# Patient Record
Sex: Male | Born: 1975 | Race: White | Hispanic: No | Marital: Married | Smoking: Never smoker
Health system: Southern US, Community
[De-identification: ages and names within clinical notes are randomized; demographics above are authoritative.]

## PROBLEM LIST (undated history)

## (undated) DIAGNOSIS — T4145XA Adverse effect of unspecified anesthetic, initial encounter: Secondary | ICD-10-CM

## (undated) DIAGNOSIS — S63042A Subluxation of carpometacarpal joint of left thumb, initial encounter: Secondary | ICD-10-CM

## (undated) DIAGNOSIS — Z98811 Dental restoration status: Secondary | ICD-10-CM

## (undated) DIAGNOSIS — Z87442 Personal history of urinary calculi: Secondary | ICD-10-CM

## (undated) DIAGNOSIS — K635 Polyp of colon: Secondary | ICD-10-CM

## (undated) DIAGNOSIS — F32A Depression, unspecified: Secondary | ICD-10-CM

## (undated) DIAGNOSIS — T8859XA Other complications of anesthesia, initial encounter: Secondary | ICD-10-CM

## (undated) DIAGNOSIS — K219 Gastro-esophageal reflux disease without esophagitis: Secondary | ICD-10-CM

## (undated) HISTORY — PX: TONSILLECTOMY: SUR1361

## (undated) HISTORY — PX: LUMBAR FUSION: SHX111

## (undated) HISTORY — PX: VASECTOMY: SHX75

## (undated) HISTORY — DX: Polyp of colon: K63.5

## (undated) HISTORY — DX: Depression, unspecified: F32.A

## (undated) HISTORY — PX: LUMBAR LAMINECTOMY: SHX95

---

## 2008-02-18 ENCOUNTER — Encounter: Admission: RE | Admit: 2008-02-18 | Discharge: 2008-02-18 | Payer: Self-pay | Admitting: Orthopaedic Surgery

## 2008-09-07 ENCOUNTER — Ambulatory Visit: Payer: Self-pay | Admitting: Internal Medicine

## 2008-09-07 DIAGNOSIS — Z87442 Personal history of urinary calculi: Secondary | ICD-10-CM | POA: Insufficient documentation

## 2008-09-07 LAB — CONVERTED CEMR LAB
AST: 19 units/L (ref 0–37)
Albumin: 4.3 g/dL (ref 3.5–5.2)
Alkaline Phosphatase: 64 units/L (ref 39–117)
BUN: 14 mg/dL (ref 6–23)
Bilirubin, Direct: 0.1 mg/dL (ref 0.0–0.3)
Chloride: 110 meq/L (ref 96–112)
Eosinophils Absolute: 0.1 10*3/uL (ref 0.0–0.7)
Eosinophils Relative: 1.5 % (ref 0.0–5.0)
GFR calc non Af Amer: 103 mL/min
HDL: 35.2 mg/dL — ABNORMAL LOW (ref >39.0)
MCV: 87.5 fL (ref 78.0–100.0)
Monocytes Relative: 10.9 % (ref 3.0–12.0)
Neutrophils Relative %: 60.7 % (ref 43.0–77.0)
Platelets: 323 10*3/uL (ref 150–400)
Potassium: 4.5 meq/L (ref 3.5–5.1)
Total CHOL/HDL Ratio: 5.2
VLDL: 27 mg/dL (ref 0–40)
WBC: 8.8 10*3/uL (ref 4.5–10.5)

## 2009-04-25 ENCOUNTER — Ambulatory Visit: Payer: Self-pay | Admitting: Internal Medicine

## 2009-04-25 DIAGNOSIS — J020 Streptococcal pharyngitis: Secondary | ICD-10-CM | POA: Insufficient documentation

## 2009-06-30 ENCOUNTER — Encounter: Admission: RE | Admit: 2009-06-30 | Discharge: 2009-06-30 | Payer: Self-pay | Admitting: Orthopaedic Surgery

## 2009-08-16 ENCOUNTER — Ambulatory Visit: Payer: Self-pay | Admitting: Internal Medicine

## 2009-08-16 DIAGNOSIS — F341 Dysthymic disorder: Secondary | ICD-10-CM | POA: Insufficient documentation

## 2009-08-23 ENCOUNTER — Telehealth: Payer: Self-pay | Admitting: Internal Medicine

## 2009-09-27 ENCOUNTER — Ambulatory Visit: Payer: Self-pay | Admitting: Internal Medicine

## 2009-09-27 DIAGNOSIS — F411 Generalized anxiety disorder: Secondary | ICD-10-CM | POA: Insufficient documentation

## 2009-12-04 ENCOUNTER — Ambulatory Visit: Payer: Self-pay | Admitting: Internal Medicine

## 2010-08-30 ENCOUNTER — Ambulatory Visit: Payer: Self-pay | Admitting: Internal Medicine

## 2010-08-30 DIAGNOSIS — J3501 Chronic tonsillitis: Secondary | ICD-10-CM

## 2010-08-30 HISTORY — DX: Chronic tonsillitis: J35.01

## 2010-09-18 ENCOUNTER — Encounter: Payer: Self-pay | Admitting: Internal Medicine

## 2011-01-15 NOTE — Assessment & Plan Note (Signed)
Summary: strept/dm   Vital Signs:  Yu profile:   35 year old male Weight:      166 pounds Temp:     98.3 degrees F oral BP sitting:   100 / 70  (left arm) Cuff size:   regular  Vitals Entered By: Duard Brady LPN (August 30, 2010 4:04 PM) CC: c/o sore throat - seen in Urgent care monday   on abx  Is Yu Diabetic? No   CC:  c/o sore throat - seen in Urgent care monday   on abx .  History of Present Illness: Kyle Yu, who presents complaining of persistent sore throat.  Roughly 5 days ago.  He presented to an urgent care due to his severe sore throat and was placed on Omnicef 300 mg b.i.d..  He does have a history of childhood allergy to penicillin, which apparently was quite severe.  He has tolerated medication well earned a week.  He had headache and fever which have resolved.  He still has sore throat and malaise, but clearly improved.  He is requesting ENT referral for consideration of tonsillectomy.  He has had recurrent episodes of strep pharyngitis, which has increased in severity to  two or 3 times per year.  He states he also has occasional swallowing difficulty, due to a chronically enlarged left tonsil  Allergies: 1)  ! Penicillin G Potassium (Penicillin G Potassium) 2)  ! * Contrast Dye  Past History:  Past Medical History: Reviewed history from 09/27/2009 and no changes required. Nephrolithiasis, hx of penicillin allergy IVP dye allergy mild dyslipidemia Anxiety  Review of Systems       The Yu complains of anorexia and fever.  The Yu denies weight loss, weight gain, vision loss, decreased hearing, hoarseness, chest pain, syncope, dyspnea on exertion, peripheral edema, prolonged cough, headaches, hemoptysis, abdominal pain, melena, hematochezia, severe indigestion/heartburn, hematuria, incontinence, genital sores, muscle weakness, suspicious skin lesions, transient blindness, difficulty walking, depression, unusual weight change,  abnormal bleeding, enlarged lymph nodes, angioedema, breast masses, and testicular masses.    Physical Exam  General:  Well-developed,well-nourished,in no acute distress; alert,appropriate and cooperative throughout examination Head:  Normocephalic and atraumatic without obvious abnormalities. No apparent alopecia or balding. Eyes:  No corneal or conjunctival inflammation noted. EOMI. Perrla. Funduscopic exam benign, without hemorrhages, exudates or papilledema. Vision grossly normal. Ears:  External ear exam shows no significant lesions or deformities.  Otoscopic examination reveals clear canals, tympanic membranes are intact bilaterally without bulging, retraction, inflammation or discharge. Hearing is grossly normal bilaterally. Mouth:  mild pharyngeal erythema and considerable tonsillar enlargement especially  the  left with scattered pustules Neck:  no adenopathy   Impression & Recommendations:  Problem # 1:  STREP THROAT (ICD-034.0)  The following medications were removed from the medication list:    Zithromax Z-pak 250 Mg Tabs (Azithromycin) .Marland Kitchen..Marland Kitchen Two initially, then one daily for 4 days His updated medication list for this problem includes:    Cefdinir 300 Mg Caps (Cefdinir) .Marland Kitchen..Marland Kitchen Two times a day x10 days the Yu has had recurrent episodes of strep pharyngitis, tonsillitis, and has chronic left tonsillitis.  Per his request, will set up for a ENT Evaluation   Orders: ENT Referral (ENT)  Complete Medication List: 1)  Lyrica 75 Mg Caps (Pregabalin) .Marland Kitchen.. 1 at bedtime 2)  Cefdinir 300 Mg Caps (Cefdinir) .... Two times a day x10 days  Yu Instructions: 1)  Take your antibiotic as prescribed until ALL of it is gone, but stop if you develop  a rash or swelling and contact our office as soon as possible. 2)  Celebrex two capsules daily 3)  ENT follow-up as scheduled

## 2011-01-15 NOTE — Consult Note (Signed)
Summary: [facility]  [facility]   Imported By: Maryln Gottron 09/25/2010 10:36:50  _____________________________________________________________________  External Attachment:    Type:   Image     Comment:   External Document

## 2011-03-20 ENCOUNTER — Encounter: Payer: Self-pay | Admitting: Internal Medicine

## 2011-03-20 ENCOUNTER — Ambulatory Visit (INDEPENDENT_AMBULATORY_CARE_PROVIDER_SITE_OTHER): Payer: BC Managed Care – PPO | Admitting: Internal Medicine

## 2011-03-20 VITALS — BP 110/70 | Temp 97.9°F | Wt 184.0 lb

## 2011-03-20 DIAGNOSIS — J029 Acute pharyngitis, unspecified: Secondary | ICD-10-CM

## 2011-03-20 DIAGNOSIS — J3501 Chronic tonsillitis: Secondary | ICD-10-CM

## 2011-03-20 MED ORDER — METRONIDAZOLE 500 MG PO TABS: 500.0000 mg | ORAL_TABLET | Freq: Three times a day (TID) | ORAL | Status: AC

## 2011-03-20 NOTE — Patient Instructions (Signed)
Take your antibiotic as prescribed until ALL of it is gone, but stop if you develop a rash, swelling, or any side effects of the medication.  Contact our office as soon as possible if  there are side effects of the medication.  Avoid all alcohol use while on this antibiotic  Call or return to clinic prn if these symptoms worsen or fail to improve as anticipated.  Consider ENT referral  Ibuprofen as needed

## 2011-03-20 NOTE — Progress Notes (Signed)
  Subjective:    Patient ID: Kyle Yu, male    DOB: 09-30-1976, 35 y.o.   MRN: [id number]  HPI  35 year old patient who has a history of chronic recurrent tonsillitis. He usually has several episodes a year and these are often positive for group A strep. He has seen ENT in the past and almost had the tonsillectomy performed but this was canceled due to job related obligations. For the past 2 days she has had worsening sore throat. There's been no definite fever a rapid strep screen today negative;  unfortunately he does have a penicillin allergy    Review of Systems  Constitutional: Negative for fever, chills, appetite change and fatigue.  HENT: Negative for hearing loss, ear pain, congestion, sore throat, trouble swallowing, neck stiffness, dental problem, voice change and tinnitus.   Eyes: Negative for pain, discharge and visual disturbance.  Respiratory: Negative for cough, chest tightness, wheezing and stridor.   Cardiovascular: Negative for chest pain, palpitations and leg swelling.  Gastrointestinal: Negative for nausea, vomiting, abdominal pain, diarrhea, constipation, blood in stool and abdominal distention.  Genitourinary: Negative for urgency, hematuria, flank pain, discharge, difficulty urinating and genital sores.  Musculoskeletal: Negative for myalgias, back pain, joint swelling, arthralgias and gait problem.  Skin: Negative for rash.  Neurological: Negative for dizziness, syncope, speech difficulty, weakness, numbness and headaches.  Hematological: Negative for adenopathy. Does not bruise/bleed easily.  Psychiatric/Behavioral: Negative for behavioral problems and dysphoric mood. The patient is not nervous/anxious.        Objective:   Physical Exam  Constitutional: He is oriented to person, place, and time. He appears well-developed.  HENT:  Head: Normocephalic.  Right Ear: External ear normal.  Left Ear: External ear normal.       Marked tonsillar enlargement left  greater than the right; no exudate. No cervical adenopathy.  Eyes: Conjunctivae and EOM are normal.  Neck: Normal range of motion.  Cardiovascular: Normal rate and normal heart sounds.   Pulmonary/Chest: Breath sounds normal.  Abdominal: Bowel sounds are normal.  Musculoskeletal: Normal range of motion. He exhibits no edema and no tenderness.  Neurological: He is alert and oriented to person, place, and time.  Psychiatric: He has a normal mood and affect. His behavior is normal.          Assessment & Plan:  Chronic recurrent tonsillitis. In view of his penicillin allergy we'll treat with metronidazole 500 mg 3 times a day for 7 days. He will consider followup with ENT for a tonsillectomy due to his chronic recurrent episodes

## 2011-03-25 ENCOUNTER — Telehealth: Payer: Self-pay | Admitting: Internal Medicine

## 2011-03-25 MED ORDER — AZITHROMYCIN 250 MG PO TABS: ORAL_TABLET | ORAL | Status: DC

## 2011-03-25 NOTE — Telephone Encounter (Signed)
rx sent to [facility]. Attempt to call pt - ans mach - lmtcb if problems ,rx done

## 2011-03-25 NOTE — Telephone Encounter (Signed)
Addended by: Duard Brady on: 03/25/2011 04:13 PM   Modules accepted: Orders

## 2011-03-25 NOTE — Telephone Encounter (Signed)
Please advise 

## 2011-03-25 NOTE — Telephone Encounter (Signed)
Pt called said that the antibiotic given for Strep is not working. Pt req zpak to be called in to [facility].

## 2011-03-25 NOTE — Telephone Encounter (Signed)
Ok for [employer]

## 2011-08-01 ENCOUNTER — Emergency Department (INDEPENDENT_AMBULATORY_CARE_PROVIDER_SITE_OTHER): Payer: BC Managed Care – PPO

## 2011-08-01 ENCOUNTER — Encounter (HOSPITAL_BASED_OUTPATIENT_CLINIC_OR_DEPARTMENT_OTHER): Payer: Self-pay | Admitting: Emergency Medicine

## 2011-08-01 ENCOUNTER — Emergency Department (HOSPITAL_BASED_OUTPATIENT_CLINIC_OR_DEPARTMENT_OTHER)
Admission: EM | Admit: 2011-08-01 | Discharge: 2011-08-01 | Disposition: A | Discharge status: Home / Self Care | Payer: BC Managed Care – PPO | Attending: Emergency Medicine | Admitting: Emergency Medicine

## 2011-08-01 DIAGNOSIS — R079 Chest pain, unspecified: Secondary | ICD-10-CM

## 2011-08-01 DIAGNOSIS — N200 Calculus of kidney: Secondary | ICD-10-CM | POA: Insufficient documentation

## 2011-08-01 DIAGNOSIS — F341 Dysthymic disorder: Secondary | ICD-10-CM | POA: Insufficient documentation

## 2011-08-01 DIAGNOSIS — S62112A Displaced fracture of triquetrum [cuneiform] bone, left wrist, initial encounter for closed fracture: Secondary | ICD-10-CM

## 2011-08-01 DIAGNOSIS — M542 Cervicalgia: Secondary | ICD-10-CM | POA: Insufficient documentation

## 2011-08-01 DIAGNOSIS — S139XXA Sprain of joints and ligaments of unspecified parts of neck, initial encounter: Secondary | ICD-10-CM | POA: Insufficient documentation

## 2011-08-01 DIAGNOSIS — E785 Hyperlipidemia, unspecified: Secondary | ICD-10-CM | POA: Insufficient documentation

## 2011-08-01 DIAGNOSIS — Z981 Arthrodesis status: Secondary | ICD-10-CM

## 2011-08-01 DIAGNOSIS — Y92410 Unspecified street and highway as the place of occurrence of the external cause: Secondary | ICD-10-CM | POA: Insufficient documentation

## 2011-08-01 DIAGNOSIS — M79609 Pain in unspecified limb: Secondary | ICD-10-CM

## 2011-08-01 DIAGNOSIS — Z79899 Other long term (current) drug therapy: Secondary | ICD-10-CM | POA: Insufficient documentation

## 2011-08-01 DIAGNOSIS — S60229A Contusion of unspecified hand, initial encounter: Secondary | ICD-10-CM

## 2011-08-01 DIAGNOSIS — S20219A Contusion of unspecified front wall of thorax, initial encounter: Secondary | ICD-10-CM

## 2011-08-01 DIAGNOSIS — S161XXA Strain of muscle, fascia and tendon at neck level, initial encounter: Secondary | ICD-10-CM

## 2011-08-01 DIAGNOSIS — S62113A Displaced fracture of triquetrum [cuneiform] bone, unspecified wrist, initial encounter for closed fracture: Secondary | ICD-10-CM | POA: Insufficient documentation

## 2011-08-01 MED ORDER — HYDROCODONE-ACETAMINOPHEN 5-500 MG PO TABS: 1.0000 | ORAL_TABLET | Freq: Four times a day (QID) | ORAL | Status: DC | PRN

## 2011-08-01 MED ORDER — IBUPROFEN 800 MG PO TABS
800.0000 mg | ORAL_TABLET | Freq: Once | ORAL | Status: AC
  Administered 2011-08-01: 800 mg via ORAL
  Filled 2011-08-01: qty 1

## 2011-08-01 MED ORDER — NAPROXEN 500 MG PO TABS: 500.0000 mg | ORAL_TABLET | Freq: Two times a day (BID) | ORAL | Status: DC

## 2011-08-01 MED ORDER — CYCLOBENZAPRINE HCL 10 MG PO TABS: 10.0000 mg | ORAL_TABLET | Freq: Two times a day (BID) | ORAL | Status: DC | PRN

## 2011-08-01 MED ORDER — OXYCODONE-ACETAMINOPHEN 5-325 MG PO TABS
2.0000 | ORAL_TABLET | Freq: Once | ORAL | Status: AC
  Administered 2011-08-01: 2 via ORAL
  Filled 2011-08-01 (×2): qty 1
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
## 2011-08-01 NOTE — ED Notes (Signed)
Pt. In no distress with family at bedside.. Pt. Has been informed by MD about x-ray results and wait.

## 2011-08-07 ENCOUNTER — Encounter (HOSPITAL_BASED_OUTPATIENT_CLINIC_OR_DEPARTMENT_OTHER): Payer: Self-pay | Admitting: *Deleted

## 2011-08-07 ENCOUNTER — Emergency Department (HOSPITAL_BASED_OUTPATIENT_CLINIC_OR_DEPARTMENT_OTHER)
Admission: EM | Admit: 2011-08-07 | Discharge: 2011-08-07 | Disposition: A | Discharge status: Home / Self Care | Payer: No Typology Code available for payment source | Attending: Emergency Medicine | Admitting: Emergency Medicine

## 2011-08-07 ENCOUNTER — Emergency Department (INDEPENDENT_AMBULATORY_CARE_PROVIDER_SITE_OTHER): Payer: No Typology Code available for payment source

## 2011-08-07 DIAGNOSIS — F341 Dysthymic disorder: Secondary | ICD-10-CM | POA: Insufficient documentation

## 2011-08-07 DIAGNOSIS — R079 Chest pain, unspecified: Secondary | ICD-10-CM

## 2011-08-07 DIAGNOSIS — Y92410 Unspecified street and highway as the place of occurrence of the external cause: Secondary | ICD-10-CM | POA: Insufficient documentation

## 2011-08-07 DIAGNOSIS — R0602 Shortness of breath: Secondary | ICD-10-CM | POA: Insufficient documentation

## 2011-08-07 DIAGNOSIS — E785 Hyperlipidemia, unspecified: Secondary | ICD-10-CM | POA: Insufficient documentation

## 2011-08-07 DIAGNOSIS — T148XXA Other injury of unspecified body region, initial encounter: Secondary | ICD-10-CM

## 2011-08-07 MED ORDER — HYDROCODONE-ACETAMINOPHEN 5-325 MG PO TABS: 2.0000 | ORAL_TABLET | ORAL | Status: AC | PRN

## 2011-08-07 NOTE — ED Provider Notes (Signed)
Medical screening examination/treatment/procedure(s) were performed by non-physician practitioner and as supervising physician I was immediately available for consultation/collaboration.   Forbes Cellar, MD 08/07/11 1701
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
## 2011-08-07 NOTE — ED Notes (Signed)
Ambulatory to radiology

## 2011-08-07 NOTE — ED Notes (Signed)
Karen Sofia, PA-C at bedside 
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
## 2011-11-13 ENCOUNTER — Ambulatory Visit (INDEPENDENT_AMBULATORY_CARE_PROVIDER_SITE_OTHER): Payer: BC Managed Care – PPO | Admitting: Family Medicine

## 2011-11-13 ENCOUNTER — Encounter: Payer: Self-pay | Admitting: Family Medicine

## 2011-11-13 VITALS — BP 120/78 | Temp 98.9°F | Wt 200.0 lb

## 2011-11-13 DIAGNOSIS — R21 Rash and other nonspecific skin eruption: Secondary | ICD-10-CM

## 2011-11-13 MED ORDER — METHYLPREDNISOLONE ACETATE 80 MG/ML IJ SUSP
120.0000 mg | Freq: Once | INTRAMUSCULAR | Status: AC
  Administered 2011-11-13: 120 mg via INTRAMUSCULAR

## 2011-11-13 NOTE — Patient Instructions (Signed)
Take over the counter antihistamine such as Allegra, Zyrtec, or Benadryl. Avoid heat for next few days.

## 2011-11-13 NOTE — Progress Notes (Signed)
  Subjective:    Patient ID: Kyle Yu, male    DOB: 16-Mar-1976, 35 y.o.   MRN: [id number]  HPI Pruritic rash. Started couple days ago after hunting over the weekend. Concerned about contact dermatitis. No alleviating factors. Exacerbated by heat. Patient denies any fever or chills.  Location is back, anterior trunk, and right hand.   Review of Systems  Constitutional: Negative for fever and chills.  Skin: Positive for rash.       Objective:   Physical Exam  Constitutional: He appears well-developed and well-nourished.  Cardiovascular: Normal rate and regular rhythm.   Pulmonary/Chest: Effort normal and breath sounds normal. No respiratory distress. He has no wheezes. He has no rales.  Skin:       Patient has slightly raised erythematous blanching rash right hand, diffusely on the back and anterior trunk. No pustules. Non-scaly.          Assessment & Plan:   pruritic skin rash. Probable contact dermatitis. Depo-Medrol 120 mg IM given. Over-the-counter antihistamine as needed.

## 2012-12-15 ENCOUNTER — Encounter: Payer: Self-pay | Admitting: Internal Medicine

## 2012-12-15 ENCOUNTER — Ambulatory Visit (INDEPENDENT_AMBULATORY_CARE_PROVIDER_SITE_OTHER): Payer: BC Managed Care – PPO | Admitting: Internal Medicine

## 2012-12-15 VITALS — BP 102/70 | HR 76 | Temp 98.1°F | Resp 16 | Wt 198.0 lb

## 2012-12-15 DIAGNOSIS — R079 Chest pain, unspecified: Secondary | ICD-10-CM

## 2012-12-15 DIAGNOSIS — E7800 Pure hypercholesterolemia, unspecified: Secondary | ICD-10-CM

## 2012-12-15 MED ORDER — ATORVASTATIN CALCIUM 80 MG PO TABS: 80.0000 mg | ORAL_TABLET | Freq: Every day | ORAL | Status: DC

## 2012-12-15 MED ORDER — ASPIRIN 81 MG PO TBEC: 81.0000 mg | DELAYED_RELEASE_TABLET | Freq: Every day | ORAL | Status: DC

## 2012-12-15 NOTE — Patient Instructions (Signed)
Stress test as discussed Take aspirin 81 mg daily Low-cholesterol dietCholesterol Cholesterol is a white, waxy, fat-like protein needed by your body in small amounts. The liver makes all the cholesterol you need. It is carried from the liver by the blood through the blood vessels. Deposits (plaque) may build up on blood vessel walls. This makes the arteries narrower and stiffer. Plaque increases the risk for heart attack and stroke. You cannot feel your cholesterol level even if it is very high. The only way to know is by a blood test to check your lipid (fats) levels. Once you know your cholesterol levels, you should keep a record of the test results. Work with your caregiver to to keep your levels in the desired range. WHAT THE RESULTS MEAN:  Total cholesterol is a rough measure of all the cholesterol in your blood.   LDL is the so-called bad cholesterol. This is the type that deposits cholesterol in the walls of the arteries. You want this level to be low.   HDL is the good cholesterol because it cleans the arteries and carries the LDL away. You want this level to be high.   Triglycerides are fat that the body can either burn for energy or store. High levels are closely linked to heart disease.  DESIRED LEVELS:  Total cholesterol below 200.   LDL below 100 for people at risk, below 70 for very high risk.   HDL above 50 is good, above 60 is best.   Triglycerides below 150.  HOW TO LOWER YOUR CHOLESTEROL:  Diet.   Choose fish or white meat chicken and [place], roasted or baked. Limit fatty cuts of red meat, fried foods, and processed meats, such as sausage and lunch meat.   Eat lots of fresh fruits and vegetables. Choose whole grains, beans, pasta, potatoes and cereals.   Use only small amounts of olive, corn or canola oils. Avoid butter, mayonnaise, shortening or palm kernel oils. Avoid foods with trans-fats.   Use skim/nonfat milk and low-fat/nonfat yogurt and cheeses. Avoid  whole milk, cream, ice cream, egg yolks and cheeses. Healthy desserts include angel food cake, ginger snaps, animal crackers, hard candy, popsicles, and low-fat/nonfat frozen yogurt. Avoid pastries, cakes, pies and cookies.   Exercise.   A regular program helps decrease LDL and raises HDL.   Helps with weight control.   Do things that increase your activity level like gardening, walking, or taking the stairs.   Medication.   May be prescribed by your caregiver to help lowering cholesterol and the risk for heart disease.   You may need medicine even if your levels are normal if you have several risk factors.  HOME CARE INSTRUCTIONS    Follow your diet and exercise programs as suggested by your caregiver.   Take medications as directed.   Have blood work done when your caregiver feels it is necessary.  MAKE SURE YOU:    Understand these instructions.   Will watch your condition.   Will get help right away if you are not doing well or get worse.  Document Released: 08/27/2001 Document Revised: 02/24/2012 Document Reviewed: 02/17/2008 [facility] Patient Information 2013 [place], [place].   Fat and Cholesterol Control Diet Cholesterol levels in your body are determined significantly by your diet. Cholesterol levels may also be related to heart disease. The following material helps to explain this relationship and discusses what you can do to help keep your heart healthy. Not all cholesterol is bad. Low-density lipoprotein (LDL) cholesterol is the "  bad" cholesterol. It may cause fatty deposits to build up inside your arteries. High-density lipoprotein (HDL) cholesterol is "good." It helps to remove the "bad" LDL cholesterol from your blood. Cholesterol is a very important risk factor for heart disease. Other risk factors are high blood pressure, smoking, stress, heredity, and weight. The heart muscle gets its supply of blood through the coronary arteries. If your LDL cholesterol is high and  your HDL cholesterol is low, you are at risk for having fatty deposits build up in your coronary arteries. This leaves less room through which blood can flow. Without sufficient blood and oxygen, the heart muscle cannot function properly and you may feel chest pains (angina pectoris). When a coronary artery closes up entirely, a part of the heart muscle may die causing a heart attack (myocardial infarction). CHECKING CHOLESTEROL When your caregiver sends your blood to a lab to be examined for cholesterol, a complete lipid (fat) profile may be done. With this test, the total amount of cholesterol and levels of LDL and HDL are determined. Triglycerides are a type of fat that circulates in the blood. They can also be used to determine heart disease risk. The list below describes what the numbers should be: Test: Total Cholesterol.  Less than 200 mg/dl.  Test: LDL "bad cholesterol."  Less than 100 mg/dl.   Less than 70 mg/dl if you are at very high risk of a heart attack or sudden cardiac death.  Test: HDL "good cholesterol."  Greater than 50 mg/dl for women.   Greater than 40 mg/dl for men.  Test: Triglycerides.  Less than 150 mg/dl.  CONTROLLING CHOLESTEROL WITH DIET Although exercise and lifestyle factors are important, your diet is key. That is because certain foods are known to raise cholesterol and others to lower it. The goal is to balance foods for their effect on cholesterol and more importantly, to replace saturated and trans fat with other types of fat, such as monounsaturated fat, polyunsaturated fat, and omega-3 fatty acids. On average, a person should consume no more than 15 to 17 g of saturated fat daily. Saturated and trans fats are considered "bad" fats, and they will raise LDL cholesterol. Saturated fats are primarily found in animal products such as meats, butter, and cream. However, that does not mean you need to give up all your favorite foods. Today, there are good tasting,  low-fat, low-cholesterol substitutes for most of the things you like to eat. Choose low-fat or nonfat alternatives. Choose round or loin cuts of red meat. These types of cuts are lowest in fat and cholesterol. Chicken (without the skin), fish, veal, and ground [place] breast are great choices. Eliminate fatty meats, such as hot dogs and salami. Even shellfish have little or no saturated fat. Have a 3 oz (85 g) portion when you eat lean meat, poultry, or fish. Trans fats are also called "partially hydrogenated oils." They are oils that have been scientifically manipulated so that they are solid at room temperature resulting in a longer shelf life and improved taste and texture of foods in which they are added. Trans fats are found in stick margarine, some tub margarines, cookies, crackers, and baked goods.   When baking and cooking, oils are a great substitute for butter. The monounsaturated oils are especially beneficial since it is believed they lower LDL and raise HDL. The oils you should avoid entirely are saturated tropical oils, such as coconut and palm.   Remember to eat a lot from food groups that  are naturally free of saturated and trans fat, including fish, fruit, vegetables, beans, grains (barley, rice, couscous, bulgur wheat), and pasta (without cream sauces).   IDENTIFYING FOODS THAT LOWER CHOLESTEROL   Soluble fiber may lower your cholesterol. This type of fiber is found in fruits such as apples, vegetables such as broccoli, potatoes, and carrots, legumes such as beans, peas, and lentils, and grains such as barley. Foods fortified with plant sterols (phytosterol) may also lower cholesterol. You should eat at least 2 g per day of these foods for a cholesterol lowering effect.   Read package labels to identify low-saturated fats, trans fat free, and low-fat foods at the supermarket. Select cheeses that have only 2 to 3 g saturated fat per ounce. Use a heart-healthy tub margarine that is free of trans  fats or partially hydrogenated oil. When buying baked goods (cookies, crackers), avoid partially hydrogenated oils. Breads and muffins should be made from whole grains (whole-wheat or whole oat flour, instead of "flour" or "enriched flour"). Buy non-creamy canned soups with reduced salt and no added fats.   FOOD PREPARATION TECHNIQUES   Never deep-fry. If you must fry, either stir-fry, which uses very little fat, or use non-stick cooking sprays. When possible, broil, bake, or roast meats, and steam vegetables. Instead of putting butter or margarine on vegetables, use lemon and herbs, applesauce, and cinnamon (for squash and sweet potatoes), nonfat yogurt, salsa, and low-fat dressings for salads.   LOW-SATURATED FAT / LOW-FAT FOOD SUBSTITUTES Meats / Saturated Fat (g)  Avoid: Steak, marbled (3 oz/85 g) / 11 g   Choose: Steak, lean (3 oz/85 g) / 4 g   Avoid: Hamburger (3 oz/85 g) / 7 g   Choose: Hamburger, lean (3 oz/85 g) / 5 g   Avoid: Ham (3 oz/85 g) / 6 g   Choose: Ham, lean cut (3 oz/85 g) / 2.4 g   Avoid: Chicken, with skin, dark meat (3 oz/85 g) / 4 g   Choose: Chicken, skin removed, dark meat (3 oz/85 g) / 2 g   Avoid: Chicken, with skin, light meat (3 oz/85 g) / 2.5 g   Choose: Chicken, skin removed, light meat (3 oz/85 g) / 1 g  Dairy / Saturated Fat (g)  Avoid: Whole milk (1 cup) / 5 g   Choose: Low-fat milk, 2% (1 cup) / 3 g   Choose: Low-fat milk, 1% (1 cup) / 1.5 g   Choose: Skim milk (1 cup) / 0.3 g   Avoid: Hard cheese (1 oz/28 g) / 6 g   Choose: Skim milk cheese (1 oz/28 g) / 2 to 3 g   Avoid: Cottage cheese, 4% fat (1 cup) / 6.5 g   Choose: Low-fat cottage cheese, 1% fat (1 cup) / 1.5 g   Avoid: Ice cream (1 cup) / 9 g   Choose: Sherbet (1 cup) / 2.5 g   Choose: Nonfat frozen yogurt (1 cup) / 0.3 g   Choose: Frozen fruit bar / trace   Avoid: Whipped cream (1 tbs) / 3.5 g   Choose: Nondairy whipped topping (1 tbs) / 1 g  Condiments / Saturated Fat  (g)  Avoid: Mayonnaise (1 tbs) / 2 g   Choose: Low-fat mayonnaise (1 tbs) / 1 g   Avoid: Butter (1 tbs) / 7 g   Choose: Extra light margarine (1 tbs) / 1 g   Avoid: Coconut oil (1 tbs) / 11.8 g   Choose: Olive oil (1 tbs) / 1.8  g   Choose: Corn oil (1 tbs) / 1.7 g   Choose: Safflower oil (1 tbs) / 1.2 g   Choose: Sunflower oil (1 tbs) / 1.4 g   Choose: Soybean oil (1 tbs) / 2.4 g   Choose: Canola oil (1 tbs) / 1 g  Document Released: 12/02/2005 Document Revised: 02/24/2012 Document Reviewed: 05/23/2011 [facility] Patient Information 2013 [place], [place].   Lipid Blood Tests Blood lipids are fats found in the circulation. Cholesterol and triglycerides are the two main lipids measured for determining your risk of getting heart and blood vessel diseases. The cholesterol in the blood is attached to two different molecules called lipoproteins. HDL is the beneficial high density lipoprotein cholesterol which reduces coronary risk. Low density lipoprotein cholesterol, the LDL, carries an increased risk for heart and vascular disease when it is high. Most of the body's fat tissue is in the form of triglycerides. Medical conditions that elevate the blood triglyceride level include diabetes, thyroid, kidney, and liver diseases.   A lipid panel usually includes the total cholesterol, LDL, and HDL blood levels. For best results, you should fast overnight before the blood tests are drawn. The following risk factors are generally accepted for different lipids:  LDL - Below 100 means low risk, 130-160 borderline risk, 160-190 high risk, above 190 is considered very high risk.   HDL - Below 40 means high risk, 40-60 average risk, 60 and higher means a reduced risk for heart and blood vessel diseases.   Total cholesterol - Below 200 means low risk, 200-240 is borderline risk, above 240 is higher risk.   Triglycerides - Below 200 means low risk, 200-400 borderline risk, above 400 means increased risk.   Many factors contribute to lipid levels including genetics, diet, exercise, body fat and medications. Reducing saturated fats in the diet and increasing fiber with fruits, vegetables and whole grains have been shown to improve blood lipids. Drugs may be prescribed to lower lipids if diet and exercise alone do not help bring the cholesterol levels under control. Document Released: 01/09/2005 Document Revised: 02/24/2012 Document Reviewed: 12/02/2005 [facility] Patient Information 2013 [place], [place].

## 2012-12-15 NOTE — Progress Notes (Signed)
Subjective:    Patient ID: Kyle Yu, male    DOB: 11-Feb-1976, 36 y.o.   MRN: [id number]  HPI 36 year old patient who is seen today to discuss the abnormal laboratory testing. He recently had a lipid profile performed that revealed a total cholesterol of 475 triglycerides 316 and HDL cholesterol of 8. He states that probably 10 years ago he did have a health maintenance exam and was not told of any cholesterol issues. Maternal grand father died of an MI at  Age 68. Mother has dyslipidemia and apparently premature coronary artery disease is fairly extensive on his mother side. The patient presents with a 3-4 month history of occasional dyspnea on exertion as well as atypical right-sided chest pain he does go to a health club 3-5 times per week but very little aerobic activities. One brother, 2 sisters are well except for diabetes. He does have a history of nephrolithiasis. Nonsmoker  Past Medical History  Diagnosis Date  . ANXIETY DEPRESSION 08/16/2009  . ANXIETY 09/27/2009  . Chronic tonsillitis 08/30/2010  . NEPHROLITHIASIS, HX OF 09/07/2008  . Hyperlipidemia     History   Social History  . Marital Status: Married    Spouse Name: N/A    Number of Children: N/A  . Years of Education: N/A   Occupational History  . Not on file.   Social History Main Topics  . Smoking status: Never Smoker   . Smokeless tobacco: Not on file  . Alcohol Use: No  . Drug Use: No  . Sexually Active: Not on file   Other Topics Concern  . Not on file   Social History Narrative  . No narrative on file    Past Surgical History  Procedure Date  . Vasectomy   . Lumbar laminectomy     No family history on file.  Allergies  Allergen Reactions  . Omnipaque (Iohexol) Other (See Comments)    Feet peel  . Penicillins Anaphylaxis  . Contrast Media (Iodinated Diagnostic Agents)     Current Outpatient Prescriptions on File Prior to Visit  Medication Sig Dispense Refill  . ibuprofen (ADVIL,MOTRIN)  200 MG tablet Take 800 mg by mouth once as needed. For pain        . pregabalin (LYRICA) 75 MG capsule Take 75 mg by mouth 2 (two) times daily.       Marland Kitchen azithromycin (ZITHROMAX Z-PAK) 250 MG tablet Take 2 tablets by mouth on day 1, followed by 1 tablet by mouth daily for 4 days. AS DIRECTED   6 each  0    BP 102/70  Pulse 76  Temp 98.1 F (36.7 C) (Oral)  Resp 16  Wt 198 lb (89.812 kg)      Review of Systems  Constitutional: Negative for fever, chills, appetite change and fatigue.  HENT: Negative for hearing loss, ear pain, congestion, sore throat, trouble swallowing, neck stiffness, dental problem, voice change and tinnitus.   Eyes: Negative for pain, discharge and visual disturbance.  Respiratory: Positive for shortness of breath. Negative for cough, chest tightness, wheezing and stridor.   Cardiovascular: Positive for chest pain. Negative for palpitations and leg swelling.  Gastrointestinal: Negative for nausea, vomiting, abdominal pain, diarrhea, constipation, blood in stool and abdominal distention.  Genitourinary: Negative for urgency, hematuria, flank pain, discharge, difficulty urinating and genital sores.  Musculoskeletal: Negative for myalgias, back pain, joint swelling, arthralgias and gait problem.  Skin: Negative for rash.  Neurological: Negative for dizziness, syncope, speech difficulty, weakness, numbness and headaches.  Hematological: Negative for adenopathy. Does not bruise/bleed easily.  Psychiatric/Behavioral: Negative for behavioral problems and dysphoric mood. The patient is not nervous/anxious.        Objective:   Physical Exam  Constitutional: He is oriented to person, place, and time. He appears well-developed.  HENT:  Head: Normocephalic.  Right Ear: External ear normal.  Left Ear: External ear normal.  Eyes: Conjunctivae normal and EOM are normal.  Neck: Normal range of motion.  Cardiovascular: Normal rate and normal heart sounds.     Pulmonary/Chest: Breath sounds normal.  Abdominal: Bowel sounds are normal.  Musculoskeletal: Normal range of motion. He exhibits no edema and no tenderness.  Neurological: He is alert and oriented to person, place, and time.  Psychiatric: He has a normal mood and affect. His behavior is normal.          Assessment & Plan:  Severe hypercholesterolemia    Severe hypercholesterolemia. Will place on atorvastatin 80. Low cholesterol diet discussed. Will set up for nuclear stress test for further risk stratification. We'll place on aspirin 81 mg

## 2012-12-21 ENCOUNTER — Telehealth: Payer: Self-pay | Admitting: *Deleted

## 2012-12-21 DIAGNOSIS — R0602 Shortness of breath: Secondary | ICD-10-CM

## 2012-12-21 DIAGNOSIS — R079 Chest pain, unspecified: Secondary | ICD-10-CM

## 2012-12-21 NOTE — Addendum Note (Signed)
Addended by: Jimmye Norman on: 12/21/2012 12:53 PM   Modules accepted: Orders

## 2012-12-21 NOTE — Telephone Encounter (Signed)
Please try to set up a  treadmill exercise stress test-Bruce protocol

## 2012-12-21 NOTE — Telephone Encounter (Signed)
Message copied by Jimmye Norman on Mon Dec 21, 2012 12:10 PM ------      Message from: Cherlyn Labella      Created: Mon Dec 21, 2012  9:18 AM       At this time the insurance company will not approve the stress test for pt. All information from the chart was given but per Nurse reviewer from [facility] it does not fit criteria needed.In order to get this procedure approved Dr. Kirtland Bouchard will needs to call [phone] option 2 with more information BEFORE 4:00 today. The case will be closed at the end of the day

## 2012-12-22 NOTE — Telephone Encounter (Signed)
Has Nicole set up the Bruce protocol ETT??

## 2012-12-23 NOTE — Telephone Encounter (Signed)
Pt is set up for 01/05/13 and is aware of appt

## 2012-12-23 NOTE — Telephone Encounter (Signed)
Dr. Amador Cunas made aware appointment is scheduled.

## 2012-12-31 ENCOUNTER — Emergency Department (HOSPITAL_COMMUNITY): Payer: BC Managed Care – PPO

## 2012-12-31 ENCOUNTER — Encounter (HOSPITAL_COMMUNITY): Payer: Self-pay | Admitting: Emergency Medicine

## 2012-12-31 ENCOUNTER — Emergency Department (HOSPITAL_COMMUNITY)
Admission: EM | Admit: 2012-12-31 | Discharge: 2013-01-01 | Disposition: A | Discharge status: Home / Self Care | Payer: BC Managed Care – PPO | Attending: Emergency Medicine | Admitting: Emergency Medicine

## 2012-12-31 DIAGNOSIS — Z7982 Long term (current) use of aspirin: Secondary | ICD-10-CM | POA: Insufficient documentation

## 2012-12-31 DIAGNOSIS — Z87440 Personal history of urinary (tract) infections: Secondary | ICD-10-CM | POA: Insufficient documentation

## 2012-12-31 DIAGNOSIS — E785 Hyperlipidemia, unspecified: Secondary | ICD-10-CM | POA: Insufficient documentation

## 2012-12-31 DIAGNOSIS — Z79899 Other long term (current) drug therapy: Secondary | ICD-10-CM | POA: Insufficient documentation

## 2012-12-31 DIAGNOSIS — R61 Generalized hyperhidrosis: Secondary | ICD-10-CM | POA: Insufficient documentation

## 2012-12-31 DIAGNOSIS — Z8659 Personal history of other mental and behavioral disorders: Secondary | ICD-10-CM | POA: Insufficient documentation

## 2012-12-31 DIAGNOSIS — R079 Chest pain, unspecified: Secondary | ICD-10-CM

## 2012-12-31 LAB — CBC
HCT: 44.1 % (ref 39.0–52.0)
MCV: 83.4 fL (ref 78.0–100.0)
RBC: 5.29 MIL/uL (ref 4.22–5.81)
RDW: 14.1 % (ref 11.5–15.5)
WBC: 8 10*3/uL (ref 4.0–10.5)

## 2012-12-31 LAB — BASIC METABOLIC PANEL WITH GFR
BUN: 17 mg/dL (ref 6–23)
CO2: 27 meq/L (ref 19–32)
Calcium: 9.9 mg/dL (ref 8.4–10.5)
Chloride: 101 meq/L (ref 96–112)
Creatinine, Ser: 0.98 mg/dL (ref 0.50–1.35)
GFR calc Af Amer: >90 mL/min
GFR calc non Af Amer: >90 mL/min
Glucose, Bld: 85 mg/dL (ref 70–99)
Potassium: 4.1 meq/L (ref 3.5–5.1)
Sodium: 137 meq/L (ref 135–145)

## 2012-12-31 LAB — POCT I-STAT TROPONIN I: Troponin i, poc: 0 ng/mL (ref 0.00–0.08)

## 2012-12-31 LAB — TROPONIN I: Troponin I: <0.3 ng/mL

## 2012-12-31 MED ORDER — NITROGLYCERIN 0.4 MG SL SUBL
0.4000 mg | SUBLINGUAL_TABLET | SUBLINGUAL | Status: DC | PRN
  Administered 2012-12-31 (×2): 0.4 mg via SUBLINGUAL
  Filled 2012-12-31: qty 25

## 2012-12-31 MED ORDER — ASPIRIN 81 MG PO CHEW
324.0000 mg | CHEWABLE_TABLET | Freq: Once | ORAL | Status: AC
  Administered 2012-12-31: 324 mg via ORAL
  Filled 2012-12-31: qty 4

## 2012-12-31 NOTE — ED Notes (Signed)
Pt presenting to ed with c/o chest pain greater than one week pt states he was recently diagnosed with elevated cholesterol and has been on medications. Pt states onset today while sitting at his desk at work. Pt states it "feels like I swallowed a Frisbee" pt states he's scheduled for a stress test next week. Pt denies nausea and vomiting

## 2012-12-31 NOTE — ED Provider Notes (Signed)
History     CSN: [id number]  Arrival date & time 12/31/12  1609   First MD Initiated Contact with Patient 12/31/12 1653      Chief Complaint  Patient presents with  . Chest Pain    (Consider location/radiation/quality/duration/timing/severity/associated sxs/prior treatment) Patient is a 37 y.o. male presenting with chest pain. The history is provided by the patient.  Chest Pain   He had onset at 3 PM of severe anterior chest pain without radiation. Pain is rated at 10/10 but has now subsided to 1/10. There is associated dyspnea and diaphoresis but no nausea. He describes it as like a kidney stone in his chest like he swallowed a Frisbee. He had one other episode similar to this about 3 weeks ago. He had seen his physician and is scheduled for stress test next week. He is also recently diagnosed with hyperlipidemia. Other cardiac risk factors are positive family history. He is a nonsmoker no history of hypertension or diabetes. 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 He does not get dyspnea with routine activities.  Past Medical History  Diagnosis Date  . ANXIETY DEPRESSION 08/16/2009  . ANXIETY 09/27/2009  . Chronic tonsillitis 08/30/2010  . NEPHROLITHIASIS, HX OF 09/07/2008  . Hyperlipidemia     Past Surgical History  Procedure Date  . Vasectomy   . Lumbar laminectomy     No family history on file.  History  Substance Use Topics  . Smoking status: Never Smoker   . Smokeless tobacco: Not on file  . Alcohol Use: No      Review of Systems  Cardiovascular: Positive for chest pain.  All other systems reviewed and are negative.    Allergies  Omnipaque; Penicillins; and Contrast media  Home Medications   Current Outpatient Rx  Name  Route  Sig  Dispense  Refill  . ASPIRIN 81 MG PO TBEC   Oral   Take 1 tablet (81 mg total) by mouth daily. Swallow whole.   30 tablet   12   . ATORVASTATIN CALCIUM 80 MG PO TABS   Oral   Take 1 tablet (80 mg total) by mouth daily.   90 tablet   3   . IBUPROFEN 200 MG PO TABS   Oral   Take 800 mg by mouth once as needed. For pain.         Marland Kitchen PREGABALIN 75 MG PO CAPS   Oral   Take 75 mg by mouth 2 (two) times daily. For back pain.         Marland Kitchen AZITHROMYCIN 250 MG PO TABS      Take 2 tablets by mouth on day 1, followed by 1 tablet by mouth daily for 4 days. AS DIRECTED    6 each   0     BP 132/68  Pulse 61  Temp 98.3 F (36.8 C) (Oral)  Resp 14  SpO2 99%  Physical Exam  Nursing note and vitals reviewed.  37 year old male, resting comfortably and in no acute distress. Vital signs are normal. Oxygen saturation is 99%, which is normal. Head is normocephalic and atraumatic. PERRLA, EOMI. Oropharynx is clear. Neck is nontender and supple without adenopathy or JVD. Back is nontender and there is no CVA tenderness. Lungs are clear without rales, wheezes, or rhonchi. Chest is nontender. Heart has regular rate and rhythm without murmur. Abdomen is soft, flat, nontender without  masses or hepatosplenomegaly and peristalsis is normoactive. Extremities have no cyanosis or edema, full range of motion is present. Skin is warm and dry without rash. Neurologic: Mental status is normal, cranial nerves are intact, there are no motor or sensory deficits.  ED Course  Procedures (including critical care time)  Results for orders placed during the hospital encounter of 12/31/12  CBC      Component Value Range   WBC 8.0  4.0 - 10.5 K/uL   RBC 5.29  4.22 - 5.81 MIL/uL   Hemoglobin 15.3  13.0 - 17.0 g/dL   HCT 62.9  52.8 - 41.3 %   MCV 83.4  78.0 - 100.0 fL   MCH 28.9  26.0 - 34.0 pg   MCHC 34.7  30.0 - 36.0 g/dL   RDW 24.4  01.0 - 27.2 %   Platelets 424 (*) 150 - 400 K/uL  BASIC METABOLIC PANEL      Component Value Range   Sodium 137  135 - 145 mEq/L   Potassium 4.1  3.5 - 5.1 mEq/L   Chloride 101  96 - 112 mEq/L    CO2 27  19 - 32 mEq/L   Glucose, Bld 85  70 - 99 mg/dL   BUN 17  6 - 23 mg/dL   Creatinine, Ser 5.36  0.50 - 1.35 mg/dL   Calcium 9.9  8.4 - 64.4 mg/dL   GFR calc non Af Amer >90  >90 mL/min   GFR calc Af Amer >90  >90 mL/min  POCT I-STAT TROPONIN I      Component Value Range   Troponin i, poc 0.00  0.00 - 0.08 ng/mL   Comment 3           TROPONIN I      Component Value Range   Troponin I <0.30  <0.30 ng/mL   Dg Chest 2 View  12/31/2012  *RADIOLOGY REPORT*  Clinical Data: Chest pain.  CHEST - 2 VIEW  Comparison: 08/07/2011  Findings: Heart and mediastinal contours are within normal limits. No focal opacities or effusions.  No acute bony abnormality.  IMPRESSION: No active cardiopulmonary disease.   Original Report Authenticated By: Charlett Nose, M.D.      ECG shows normal sinus rhythm with a rate of 60, no ectopy. Normal axis. Normal P wave. Normal QRS. Normal intervals. Normal ST and T waves. Impression: normal ECG. No prior ECG available for comparison.  Repeat ECG shows normal sinus rhythm with a rate of 55, no ectopy. Normal axis. Normal P wave. Normal QRS. Normal intervals. Normal ST and T waves. Impression: normal ECG.   1. Chest pain       MDM  Chest pain of uncertain cause. His records of been reviewed and his cholesterol was over 400 and he was just started on a statin. He takes 81 mg of aspirin a day so we will given an additional dose of aspirin and nitroglycerin to try and resolve the remainder of his chest pain. He will need to have serial cardiac markers.  He got complete relief of his discomfort with nitroglycerin. Initial ECG had questionable J-point elevation in the inferior leads but repeat tracing was unchanged. He will be kept in the ED and this is troponin 6 hours after relief of pain it is negative, he will be allowed to go home to have his stress test next week as scheduled.      Dione Booze, MD 01/01/13 [id number]

## 2012-12-31 NOTE — ED Notes (Signed)
Pt sts had lab work done couple of weeks ago and was told his cholesterol was extremely high, over 400 as well as his lipids. Pt sts today he had a sharp pain in central chest, "felt like my heart was having kidney stones". Pt sts pain has subsided significantly after second nitroglycerin. Pt denies stress or anxiety and reports that his grandfather died in his late 2's due to massive heart attack.

## 2012-12-31 NOTE — ED Notes (Signed)
Move to RM 32

## 2013-01-01 LAB — TROPONIN I: Troponin I: <0.3 ng/mL (ref <0.30)

## 2013-01-01 MED ORDER — NITROGLYCERIN 0.4 MG SL SUBL: 0.4000 mg | SUBLINGUAL_TABLET | SUBLINGUAL | Status: DC | PRN

## 2013-01-01 NOTE — ED Provider Notes (Signed)
Rec. Sign-over from Dr. Preston Fleeting at midnight.  Pt was awaiting a repeat trop.  The delta trop is negative.  Plan discharge home.  Tobin Chad, MD 01/01/13 [phone]

## 2013-01-04 ENCOUNTER — Telehealth: Payer: Self-pay | Admitting: Internal Medicine

## 2013-01-04 NOTE — Telephone Encounter (Signed)
Pt will wants to know results of stress test that is scheduled tomorrow 1/21 as soon as they can.  So  Pt made appt on Wed 8am for this information.. Pt wants to make sure results will be back by then, or will you call  with results and patient will cancel Wed appt.  Pls advise.

## 2013-01-04 NOTE — Telephone Encounter (Signed)
Pt aware.

## 2013-01-04 NOTE — Telephone Encounter (Signed)
Pt needs to keep appointment as scheduled for follow up from ED visit  and we should have test results back. Thanks

## 2013-01-05 ENCOUNTER — Ambulatory Visit (INDEPENDENT_AMBULATORY_CARE_PROVIDER_SITE_OTHER): Payer: BC Managed Care – PPO | Admitting: Physician Assistant

## 2013-01-05 DIAGNOSIS — R0602 Shortness of breath: Secondary | ICD-10-CM

## 2013-01-05 DIAGNOSIS — R079 Chest pain, unspecified: Secondary | ICD-10-CM

## 2013-01-05 NOTE — Progress Notes (Signed)
Kyle Yu is a 37 y.o. male referred by PCP for ETT.  He has no hx of CAD.  PMH:  Newly dx HL.  Started Lipitor 80 2 weeks ago.  SHx: remote hx of tobacco use.  FHx:  Remote + for CAD (Maternal GF with MI in 30s).  Patient notes exertional dyspnea.  Also has had 2 episodes of chest pain.  He went to ED on one occasion with normal CEs.  Exam unremarkable.  ECG normal.    Exercise Treadmill Test  Pre-Exercise Testing Evaluation Rhythm: normal sinus  Rate: 72                 Test  Exercise Tolerance Test Ordering MD: Dr. Amador Cunas  Interpreting MD: Tereso Newcomer, PA-C  Unique Test No: 1  Treadmill:  1  Indication for ETT: chest pain - rule out ischemia  Contraindication to ETT: No   Stress Modality: exercise - treadmill  Cardiac Imaging Performed: non   Protocol: standard Bruce - maximal  Max BP:  148/80  Max MPHR (bpm):  182 85% MPR (bpm):  155  MPHR obtained (bpm):  169 % MPHR obtained:  92%  Reached 85% MPHR (min:sec):  9:20 Total Exercise Time (min-sec):  10:01  Workload in METS:  11.4 Borg Scale: 20  Reason ETT Terminated:  patient's desire to stop    ST Segment Analysis At Rest: normal ST segments - no evidence of significant ST depression With Exercise: no evidence of significant ST depression  Other Information Arrhythmia:  No Angina during ETT:  present (1) Quality of ETT:  diagnostic  ETT Interpretation:  normal - no evidence of ischemia by ST analysis  Comments: Good exercise tolerance. Patient did complain of chest pain. Normal BP response to exercise. No ST-T changes to suggest ischemia.   Recommendations: Follow up with PCP as directed. Signed,  Tereso Newcomer, PA-C  3:31 PM 01/05/2013

## 2013-01-06 ENCOUNTER — Ambulatory Visit (INDEPENDENT_AMBULATORY_CARE_PROVIDER_SITE_OTHER): Payer: BC Managed Care – PPO | Admitting: Internal Medicine

## 2013-01-06 ENCOUNTER — Encounter: Payer: Self-pay | Admitting: Internal Medicine

## 2013-01-06 VITALS — BP 132/80 | HR 74 | Temp 98.9°F | Resp 18 | Wt 200.0 lb

## 2013-01-06 DIAGNOSIS — E7800 Pure hypercholesterolemia, unspecified: Secondary | ICD-10-CM

## 2013-01-06 DIAGNOSIS — K219 Gastro-esophageal reflux disease without esophagitis: Secondary | ICD-10-CM

## 2013-01-06 LAB — TSH: TSH: 0.33 u[IU]/mL — ABNORMAL LOW (ref 0.35–5.50)

## 2013-01-06 LAB — LDL CHOLESTEROL, DIRECT: Direct LDL: 125.7 mg/dL

## 2013-01-06 LAB — LIPID PANEL
HDL: 13 mg/dL — ABNORMAL LOW (ref >39.00)
Triglycerides: 221 mg/dL — ABNORMAL HIGH (ref 0.0–149.0)

## 2013-01-06 MED ORDER — OMEPRAZOLE 40 MG PO CPDR: 40.0000 mg | DELAYED_RELEASE_CAPSULE | Freq: Every day | ORAL | Status: DC

## 2013-01-06 NOTE — Progress Notes (Signed)
Subjective:    Patient ID: Kyle Yu, male    DOB: [birth year], 37 y.o.   MRN: [id number]  HPI 37 year old patient who is seen today in followup. Through a scope he was placed on high-dose statin therapy due to a cholesterol of 475. [facility] and [facility] the following day for another fingerstick cholesterol which was even higher. A lipid profile here in 2009 revealed total cholesterol of 185. He has had lab performed for insurance purposes and has obtained a preferred rate-presumably because he had a normal lipid profile and no other risk factors. He was seen in the ED recently for atypical chest pain and has had subsequent normal stress test. He does have a considerable reflux symptoms  Past Medical History  Diagnosis Date  . ANXIETY DEPRESSION 08/16/2009  . ANXIETY 09/27/2009  . Chronic tonsillitis 08/30/2010  . NEPHROLITHIASIS, HX OF 09/07/2008  . Hyperlipidemia     History   Social History  . Marital Status: Married    Spouse Name: N/A    Number of Children: N/A  . Years of Education: N/A   Occupational History  . Not on file.   Social History Main Topics  . Smoking status: Never Smoker   . Smokeless tobacco: Not on file  . Alcohol Use: No  . Drug Use: No  . Sexually Active: Not on file   Other Topics Concern  . Not on file   Social History Narrative  . No narrative on file    Past Surgical History  Procedure Date  . Vasectomy   . Lumbar laminectomy     No family history on file.  Allergies  Allergen Reactions  . Omnipaque (Iohexol) Other (See Comments)    Feet peel  . Penicillins Anaphylaxis  . Contrast Media (Iodinated Diagnostic Agents)     Current Outpatient Prescriptions on File Prior to Visit  Medication Sig Dispense Refill  . aspirin 81 MG EC tablet Take 1 tablet (81 mg total) by mouth daily. Swallow whole.  30 tablet  12  . atorvastatin (LIPITOR) 80 MG tablet Take 1 tablet (80 mg total) by mouth daily.  90 tablet  3   . ibuprofen (ADVIL,MOTRIN) 200 MG tablet Take 800 mg by mouth once as needed. For pain.      . pregabalin (LYRICA) 75 MG capsule Take 75 mg by mouth 2 (two) times daily. For back pain.        BP 132/80  Pulse 74  Temp 98.9 F (37.2 C) (Oral)  Resp 18  Wt 200 lb (90.719 kg)  SpO2 98%        Review of Systems  Constitutional: Negative for fever, chills, appetite change and fatigue.  HENT: Negative for hearing loss, ear pain, congestion, sore throat, trouble swallowing, neck stiffness, dental problem, voice change and tinnitus.   Eyes: Negative for pain, discharge and visual disturbance.  Respiratory: Negative for cough, chest tightness, wheezing and stridor.   Cardiovascular: Positive for chest pain. Negative for palpitations and leg swelling.  Gastrointestinal: Negative for nausea, vomiting, abdominal pain, diarrhea, constipation, blood in stool and abdominal distention.  Genitourinary: Negative for urgency, hematuria, flank pain, discharge, difficulty urinating and genital sores.  Musculoskeletal: Negative for myalgias, back pain, joint swelling, arthralgias and gait problem.  Skin: Negative for rash.  Neurological: Negative for dizziness, syncope, speech difficulty, weakness, numbness and headaches.  Hematological: Negative for adenopathy. Does not bruise/bleed easily.  Psychiatric/Behavioral: Negative for behavioral problems and dysphoric mood. The  patient is not nervous/anxious.        Objective:   Physical Exam  Constitutional: He is oriented to person, place, and time. He appears well-developed.  HENT:  Head: Normocephalic.  Right Ear: External ear normal.  Left Ear: External ear normal.  Eyes: Conjunctivae normal and EOM are normal.  Neck: Normal range of motion.  Cardiovascular: Normal rate and normal heart sounds.   Pulmonary/Chest: Breath sounds normal.  Abdominal: Bowel sounds are normal.  Musculoskeletal: Normal range of motion. He exhibits no edema and no  tenderness.  Neurological: He is alert and oriented to person, place, and time.  Psychiatric: He has a normal mood and affect. His behavior is normal.          Assessment & Plan:  History of hypercholesterolemia. We'll check a lipid profile and TSH today GERD. Will place on anti-reflex regimen and short-term PPI therapy

## 2013-01-06 NOTE — Patient Instructions (Addendum)
Avoids foods high in acid such as tomatoes citrus juices, and spicy foods.  Avoid eating within two hours of lying down or before exercising.  Do not overheat.  Try smaller more frequent meals.  If symptoms persist, elevate the head of her bed 12 inches while sleeping.    It is important that you exercise regularly, at least 20 minutes 3 to 4 times per week.  If you develop chest pain or shortness of breath seek  medical attention.  Diet for Gastroesophageal Reflux Disease, Adult Reflux (acid reflux) is when acid from your stomach flows up into the esophagus. When acid comes in contact with the esophagus, the acid causes irritation and soreness (inflammation) in the esophagus. When reflux happens often or so severely that it causes damage to the esophagus, it is called gastroesophageal reflux disease (GERD). Nutrition therapy can help ease the discomfort of GERD. FOODS OR DRINKS TO AVOID OR LIMIT  Smoking or chewing tobacco. Nicotine is one of the most potent stimulants to acid production in the gastrointestinal tract.   Caffeinated and decaffeinated coffee and black tea.   Regular or low-calorie carbonated beverages or energy drinks (caffeine-free carbonated beverages are allowed).     Strong spices, such as black pepper, white pepper, red pepper, cayenne, curry powder, and chili powder.   Peppermint or spearmint.   Chocolate.   High-fat foods, including meats and fried foods. Extra added fats including oils, butter, salad dressings, and nuts. Limit these to less than 8 tsp per day.   Fruits and vegetables if they are not tolerated, such as citrus fruits or tomatoes.   Alcohol.   Any food that seems to aggravate your condition.  If you have questions regarding your diet, call your caregiver or a registered dietitian. OTHER THINGS THAT MAY HELP GERD INCLUDE:    Eating your meals slowly, in a relaxed setting.   Eating 5 to 6 small meals per day instead of 3 large meals.   Eliminating  food for a period of time if it causes distress.   Not lying down until 3 hours after eating a meal.   Keeping the head of your bed raised 6 to 9 inches (15 to 23 cm) by using a foam wedge or blocks under the legs of the bed. Lying flat may make symptoms worse.   Being physically active. Weight loss may be helpful in reducing reflux in overweight or obese adults.   Wear loose fitting clothing  EXAMPLE MEAL PLAN This meal plan is approximately 2,000 calories based on https://www.[facility].org/ meal planning guidelines. Breakfast   cup cooked oatmeal.   1 cup strawberries.   1 cup low-fat milk.   1 oz almonds.  Snack  1 cup cucumber slices.   6 oz yogurt (made from low-fat or fat-free milk).  Lunch  2 slice whole-wheat bread.   2 oz sliced [place].   2 tsp mayonnaise.   1 cup blueberries.   1 cup snap peas.  Snack  6 whole-wheat crackers.   1 oz string cheese.  Dinner   cup brown rice.   1 cup mixed veggies.   1 tsp olive oil.   3 oz grilled fish.  Document Released: 12/02/2005 Document Revised: 02/24/2012 Document Reviewed: 10/18/2011 [facility] Patient Information 2013 [place], [place].

## 2013-01-10 ENCOUNTER — Encounter: Payer: Self-pay | Admitting: Internal Medicine

## 2013-01-13 ENCOUNTER — Telehealth: Payer: Self-pay | Admitting: *Deleted

## 2013-01-13 NOTE — Telephone Encounter (Signed)
Left message on voicemail. Labs were released through My Chart. Keep follow up appt to discuss labs and other issue.

## 2013-01-28 ENCOUNTER — Ambulatory Visit: Payer: BC Managed Care – PPO | Admitting: Internal Medicine

## 2013-01-30 ENCOUNTER — Other Ambulatory Visit: Payer: Self-pay

## 2013-02-01 ENCOUNTER — Ambulatory Visit (INDEPENDENT_AMBULATORY_CARE_PROVIDER_SITE_OTHER): Payer: BC Managed Care – PPO | Admitting: Internal Medicine

## 2013-02-01 ENCOUNTER — Encounter: Payer: Self-pay | Admitting: Internal Medicine

## 2013-02-01 VITALS — BP 122/80 | HR 80 | Temp 97.6°F | Resp 18 | Wt 200.0 lb

## 2013-02-01 DIAGNOSIS — F411 Generalized anxiety disorder: Secondary | ICD-10-CM

## 2013-02-01 DIAGNOSIS — F341 Dysthymic disorder: Secondary | ICD-10-CM

## 2013-02-01 DIAGNOSIS — E7800 Pure hypercholesterolemia, unspecified: Secondary | ICD-10-CM

## 2013-02-01 NOTE — Progress Notes (Signed)
Subjective:    Patient ID: Kyle Yu, male    DOB: 1976/01/23, 37 y.o.   MRN: [id number]  HPI  37 year old patient who is seen today following initiation of statin therapy. He had to a lipid profiles performed that are not available for review. These were performed at the [facility] but both the revealed a total cholesterol level in excess of 400. He has been on atorvastatin 80 mg daily which he has tolerated well he has had a recent lipid panel performed which revealed a calculated LDL cholesterol of 125.  Past Medical History  Diagnosis Date  . ANXIETY DEPRESSION 08/16/2009  . ANXIETY 09/27/2009  . Chronic tonsillitis 08/30/2010  . NEPHROLITHIASIS, HX OF 09/07/2008  . Hyperlipidemia     History   Social History  . Marital Status: Married    Spouse Name: N/A    Number of Children: N/A  . Years of Education: N/A   Occupational History  . Not on file.   Social History Main Topics  . Smoking status: Never Smoker   . Smokeless tobacco: Not on file  . Alcohol Use: No  . Drug Use: No  . Sexually Active: Not on file   Other Topics Concern  . Not on file   Social History Narrative  . No narrative on file    Past Surgical History  Procedure Laterality Date  . Vasectomy    . Lumbar laminectomy      No family history on file.  Allergies  Allergen Reactions  . Omnipaque (Iohexol) Other (See Comments)    Feet peel  . Penicillins Anaphylaxis  . Contrast Media (Iodinated Diagnostic Agents)     Current Outpatient Prescriptions on File Prior to Visit  Medication Sig Dispense Refill  . atorvastatin (LIPITOR) 80 MG tablet Take 1 tablet (80 mg total) by mouth daily.  90 tablet  3  . ibuprofen (ADVIL,MOTRIN) 200 MG tablet Take 800 mg by mouth once as needed. For pain.      Marland Kitchen omeprazole (PRILOSEC) 40 MG capsule Take 1 capsule (40 mg total) by mouth daily.  30 capsule  3  . pregabalin (LYRICA) 75 MG capsule Take 75 mg by mouth 2 (two) times daily. For back pain.      Marland Kitchen aspirin 81  MG EC tablet Take 1 tablet (81 mg total) by mouth daily. Swallow whole.  30 tablet  12   No current facility-administered medications on file prior to visit.    BP 122/80  Pulse 80  Temp(Src) 97.6 F (36.4 C) (Oral)  Resp 18  Wt 200 lb (90.719 kg)  BMI 27.12 kg/m2  SpO2 97%       Review of Systems  Constitutional: Negative for fever, chills, appetite change and fatigue.  HENT: Negative for hearing loss, ear pain, congestion, sore throat, trouble swallowing, neck stiffness, dental problem, voice change and tinnitus.   Eyes: Negative for pain, discharge and visual disturbance.  Respiratory: Negative for cough, chest tightness, wheezing and stridor.   Cardiovascular: Negative for chest pain, palpitations and leg swelling.  Gastrointestinal: Negative for nausea, vomiting, abdominal pain, diarrhea, constipation, blood in stool and abdominal distention.  Genitourinary: Negative for urgency, hematuria, flank pain, discharge, difficulty urinating and genital sores.  Musculoskeletal: Negative for myalgias, back pain, joint swelling, arthralgias and gait problem.  Skin: Negative for rash.  Neurological: Negative for dizziness, syncope, speech difficulty, weakness, numbness and headaches.  Hematological: Negative for adenopathy. Does not bruise/bleed easily.  Psychiatric/Behavioral: Negative for behavioral problems and dysphoric mood.  The patient is not nervous/anxious.        Objective:   Physical Exam  Constitutional: He appears well-developed and well-nourished. No distress.  Blood pressure 120/80 Weight 200          Assessment & Plan:   Mixed  Hyperlipidemia-  calculated LDL cholesterol still 125 on atorvastatin 80.  We'll continue exercise program better diet in modest weight loss. We'll recheck a lipid profile in 3 months. Hopefully with  better diet and exercise triglycerides and VLDL cholesterol will improve as well as the HDL cholesterol

## 2013-02-01 NOTE — Patient Instructions (Signed)
It is important that you exercise regularly, at least 20 minutes 3 to 4 times per week.  If you develop chest pain or shortness of breath seek  medical attention.   

## 2013-04-21 ENCOUNTER — Encounter: Payer: Self-pay | Admitting: Internal Medicine

## 2013-04-21 ENCOUNTER — Ambulatory Visit (INDEPENDENT_AMBULATORY_CARE_PROVIDER_SITE_OTHER): Payer: BC Managed Care – PPO | Admitting: Internal Medicine

## 2013-04-21 VITALS — BP 130/80 | HR 64 | Temp 98.3°F | Resp 18 | Wt 195.0 lb

## 2013-04-21 DIAGNOSIS — E7800 Pure hypercholesterolemia, unspecified: Secondary | ICD-10-CM

## 2013-04-21 LAB — LIPID PANEL
Cholesterol: 144 mg/dL (ref 0–200)
HDL: 5.4 mg/dL — ABNORMAL LOW (ref >39.00)
Total CHOL/HDL Ratio: 27
VLDL: 45.6 mg/dL — ABNORMAL HIGH (ref 0.0–40.0)

## 2013-04-21 NOTE — Patient Instructions (Addendum)
It is important that you exercise regularly, at least 20 minutes 3 to 4 times per week.  If you develop chest pain or shortness of breath seek  medical attention.  Return in one year for follow-up   

## 2013-04-21 NOTE — Progress Notes (Signed)
Subjective:    Patient ID: Kyle Yu, male    DOB: 1976/03/26, 37 y.o.   MRN: [id number]  HPI  37 year old patient who is seen today for followup of dyslipidemia. He remains on atorvastatin 80 and continues to tolerate it well. His weight is down 5 pounds. No concerns or complaints today.  Past Medical History  Diagnosis Date  . ANXIETY DEPRESSION 08/16/2009  . ANXIETY 09/27/2009  . Chronic tonsillitis 08/30/2010  . NEPHROLITHIASIS, HX OF 09/07/2008  . Hyperlipidemia     History   Social History  . Marital Status: Married    Spouse Name: N/A    Number of Children: N/A  . Years of Education: N/A   Occupational History  . Not on file.   Social History Main Topics  . Smoking status: Never Smoker   . Smokeless tobacco: Not on file  . Alcohol Use: No  . Drug Use: No  . Sexually Active: Not on file   Other Topics Concern  . Not on file   Social History Narrative  . No narrative on file    Past Surgical History  Procedure Laterality Date  . Vasectomy    . Lumbar laminectomy      No family history on file.  Allergies  Allergen Reactions  . Omnipaque (Iohexol) Other (See Comments)    Feet peel  . Penicillins Anaphylaxis  . Contrast Media (Iodinated Diagnostic Agents)     Current Outpatient Prescriptions on File Prior to Visit  Medication Sig Dispense Refill  . atorvastatin (LIPITOR) 80 MG tablet Take 1 tablet (80 mg total) by mouth daily.  90 tablet  3  . ibuprofen (ADVIL,MOTRIN) 200 MG tablet Take 800 mg by mouth once as needed. For pain.      Marland Kitchen omeprazole (PRILOSEC) 40 MG capsule Take 1 capsule (40 mg total) by mouth daily.  30 capsule  3  . pregabalin (LYRICA) 75 MG capsule Take 75 mg by mouth 2 (two) times daily. For back pain.       No current facility-administered medications on file prior to visit.    BP 130/80  Pulse 64  Temp(Src) 98.3 F (36.8 C) (Oral)  Resp 18  Wt 195 lb (88.451 kg)  BMI 26.44 kg/m2  SpO2 98%       Review of Systems   Constitutional: Negative for fever, chills, appetite change and fatigue.  HENT: Negative for hearing loss, ear pain, congestion, sore throat, trouble swallowing, neck stiffness, dental problem, voice change and tinnitus.   Eyes: Negative for pain, discharge and visual disturbance.  Respiratory: Negative for cough, chest tightness, wheezing and stridor.   Cardiovascular: Negative for chest pain, palpitations and leg swelling.  Gastrointestinal: Negative for nausea, vomiting, abdominal pain, diarrhea, constipation, blood in stool and abdominal distention.  Genitourinary: Negative for urgency, hematuria, flank pain, discharge, difficulty urinating and genital sores.  Musculoskeletal: Negative for myalgias, back pain, joint swelling, arthralgias and gait problem.  Skin: Negative for rash.  Neurological: Negative for dizziness, syncope, speech difficulty, weakness, numbness and headaches.  Hematological: Negative for adenopathy. Does not bruise/bleed easily.  Psychiatric/Behavioral: Negative for behavioral problems and dysphoric mood. The patient is not nervous/anxious.        Objective:   Physical Exam  Constitutional: He is oriented to person, place, and time. He appears well-developed.  HENT:  Head: Normocephalic.  Right Ear: External ear normal.  Left Ear: External ear normal.  Eyes: Conjunctivae and EOM are normal.  Neck: Normal range of motion.  Cardiovascular:  Normal rate and normal heart sounds.   Pulmonary/Chest: Breath sounds normal.  Abdominal: Bowel sounds are normal.  Musculoskeletal: Normal range of motion. He exhibits no edema and no tenderness.  Neurological: He is alert and oriented to person, place, and time.  Psychiatric: He has a normal mood and affect. His behavior is normal.          Assessment & Plan:   Dyslipidemia. We'll continue atorvastatin 80. Repeat lipid profile will be reviewed Exercise proper diet all encouraged  Recheck 1 year

## 2013-06-12 ENCOUNTER — Other Ambulatory Visit: Payer: Self-pay | Admitting: Internal Medicine

## 2013-07-23 ENCOUNTER — Other Ambulatory Visit: Payer: Self-pay | Admitting: Otolaryngology

## 2013-10-16 DIAGNOSIS — S63042A Subluxation of carpometacarpal joint of left thumb, initial encounter: Secondary | ICD-10-CM

## 2013-10-16 HISTORY — DX: Subluxation of carpometacarpal joint of left thumb, initial encounter: S63.042A

## 2013-10-21 ENCOUNTER — Other Ambulatory Visit: Payer: Self-pay

## 2013-10-27 ENCOUNTER — Emergency Department (HOSPITAL_COMMUNITY): Payer: BC Managed Care – PPO

## 2013-10-27 ENCOUNTER — Encounter (HOSPITAL_COMMUNITY): Payer: Self-pay | Admitting: Emergency Medicine

## 2013-10-27 ENCOUNTER — Emergency Department (HOSPITAL_COMMUNITY)
Admission: EM | Admit: 2013-10-27 | Discharge: 2013-10-27 | Disposition: A | Discharge status: Home / Self Care | Payer: BC Managed Care – PPO | Attending: Emergency Medicine | Admitting: Emergency Medicine

## 2013-10-27 DIAGNOSIS — X500XXA Overexertion from strenuous movement or load, initial encounter: Secondary | ICD-10-CM | POA: Insufficient documentation

## 2013-10-27 DIAGNOSIS — Z8659 Personal history of other mental and behavioral disorders: Secondary | ICD-10-CM | POA: Insufficient documentation

## 2013-10-27 DIAGNOSIS — Z8639 Personal history of other endocrine, nutritional and metabolic disease: Secondary | ICD-10-CM | POA: Insufficient documentation

## 2013-10-27 DIAGNOSIS — S63102A Unspecified subluxation of left thumb, initial encounter: Secondary | ICD-10-CM

## 2013-10-27 DIAGNOSIS — Y929 Unspecified place or not applicable: Secondary | ICD-10-CM | POA: Insufficient documentation

## 2013-10-27 DIAGNOSIS — Z8709 Personal history of other diseases of the respiratory system: Secondary | ICD-10-CM | POA: Insufficient documentation

## 2013-10-27 DIAGNOSIS — Z880 Allergy status to penicillin: Secondary | ICD-10-CM | POA: Insufficient documentation

## 2013-10-27 DIAGNOSIS — Z862 Personal history of diseases of the blood and blood-forming organs and certain disorders involving the immune mechanism: Secondary | ICD-10-CM | POA: Insufficient documentation

## 2013-10-27 DIAGNOSIS — Z87442 Personal history of urinary calculi: Secondary | ICD-10-CM | POA: Insufficient documentation

## 2013-10-27 DIAGNOSIS — S63056A Dislocation of other carpometacarpal joint of unspecified hand, initial encounter: Secondary | ICD-10-CM | POA: Insufficient documentation

## 2013-10-27 DIAGNOSIS — Z79899 Other long term (current) drug therapy: Secondary | ICD-10-CM | POA: Insufficient documentation

## 2013-10-27 DIAGNOSIS — Y9389 Activity, other specified: Secondary | ICD-10-CM | POA: Insufficient documentation

## 2013-10-27 MED ORDER — IBUPROFEN 800 MG PO TABS
800.0000 mg | ORAL_TABLET | Freq: Once | ORAL | Status: AC
  Administered 2013-10-27: 800 mg via ORAL
  Filled 2013-10-27: qty 1

## 2013-10-27 MED ORDER — OXYCODONE-ACETAMINOPHEN 5-325 MG PO TABS: 2.0000 | ORAL_TABLET | ORAL | Status: DC | PRN

## 2013-10-27 MED ORDER — DIPHENHYDRAMINE HCL 25 MG PO CAPS
25.0000 mg | ORAL_CAPSULE | Freq: Once | ORAL | Status: AC
  Administered 2013-10-27: 25 mg via ORAL
  Filled 2013-10-27: qty 1

## 2013-10-27 MED ORDER — OXYCODONE-ACETAMINOPHEN 5-325 MG PO TABS
2.0000 | ORAL_TABLET | Freq: Once | ORAL | Status: AC
  Administered 2013-10-27: 2 via ORAL
  Filled 2013-10-27: qty 2

## 2013-10-27 NOTE — ED Notes (Signed)
Pt was using crossbow, was releasing string and bent thumb of L hand all the way back. Pt unable to move L thumb. Swelling and bruising noted to site.

## 2013-10-27 NOTE — ED Provider Notes (Signed)
CSN: [id number]     Arrival date & time 10/27/13  1904 History  This chart was scribed for non-physician practitioner Junius Finner, PA-C working with Gerhard Munch, MD by Caryn Bee, ED Scribe. This patient was seen in room WTR5/WTR5 and the patient's care was started at 7:55 PM.    Chief Complaint  Patient presents with  . Hand Pain   HPI HPI Comments: Kyle Yu is a 37 y.o. male who presents to the Emergency Department complaining of sudden onset, severe left hand pain onset while pt was using a crossbow around 7:00 PM. Pt states that while he was releasing the string his left thumb bent all of the way back. He reports pain in the area surrounding his left thumb. Pt reports associated numbness and tingling throughout his hand. Pt denies left elbow pain. He has h/o left wrist surgery. Pt is allergic to morphine, percocet, and penicillin. Percocet and morphine cause severe itching. Pt is right handed. Pt's orthopedist is Dr. Merlyn Lot.    Past Medical History  Diagnosis Date  . ANXIETY DEPRESSION 08/16/2009  . ANXIETY 09/27/2009  . Chronic tonsillitis 08/30/2010  . NEPHROLITHIASIS, HX OF 09/07/2008  . Hyperlipidemia    Past Surgical History  Procedure Laterality Date  . Vasectomy    . Lumbar laminectomy    . Tonsillectomy     History reviewed. No pertinent family history. History  Substance Use Topics  . Smoking status: Never Smoker   . Smokeless tobacco: Not on file  . Alcohol Use: No    Review of Systems  Musculoskeletal: Positive for arthralgias (Left thumb).  Neurological: Positive for numbness.       Paresthesias to left hand.   All other systems reviewed and are negative.    Allergies  Omnipaque; Penicillins; Contrast media; Morphine and related; and Percocet  Home Medications   Current Outpatient Rx  Name  Route  Sig  Dispense  Refill  . esomeprazole (NEXIUM) 20 MG capsule   Oral   Take 20 mg by mouth daily at 12 noon.         . pregabalin (LYRICA)  75 MG capsule   Oral   Take 75 mg by mouth 2 (two) times daily. For back pain.         Marland Kitchen oxyCODONE-acetaminophen (PERCOCET/ROXICET) 5-325 MG per tablet   Oral   Take 2 tablets by mouth every 4 (four) hours as needed for severe pain.   15 tablet   0    Triage Vitals: BP 132/94  Pulse 111  Temp(Src) 97.8 F (36.6 C) (Oral)  Resp 28  SpO2 100%  Physical Exam  Nursing note and vitals reviewed. Constitutional: He is oriented to person, place, and time. He appears well-developed and well-nourished.  HENT:  Head: Normocephalic and atraumatic.  Eyes: EOM are normal.  Neck: Normal range of motion.  Cardiovascular: Normal rate.   Pulmonary/Chest: Effort normal.  Musculoskeletal: Normal range of motion. He exhibits edema and tenderness.  Left hand moderate swelling over thenar muscle. Mild ecchymosis and erythema. Exquisite tenderness of radial aspect of wrist and thumb. Limited ROM due to pain. Radial pulse 2+. Capillary refill less than 2 seconds.   Neurological: He is alert and oriented to person, place, and time.  Skin: Skin is warm and dry.  Psychiatric: He has a normal mood and affect. His behavior is normal.    ED Course  Procedures  Reduction of dislocation Date/Time: 05/16/2013 11:41 AM Performed by: Gerhard Munch MD Authorized by: Jeraldine Loots,  Molly Maduro MD Consent: Verbal consent obtained. Risks and benefits: risks, benefits and alternatives were discussed Consent given by: patient Required items: required blood products, implants, devices, and special equipment available Time out: Immediately prior to procedure a "time out" was called to verify the correct patient, procedure, equipment, support staff and site/side marked as required.  Pain medication, percocet was given prior to reduction.  Ice also applied to area prior to reduction.  Joint: 1st MCP Reduction technique: traction  Patient tolerance: Patient tolerated the procedure well with no immediate  complications.     DIAGNOSTIC STUDIES: Oxygen Saturation is 100% on room air, normal by my interpretation.    COORDINATION OF CARE: 7:58 PM-Discussed treatment plan which with pt at bedside and pt agreed to plan.   Labs Review Labs Reviewed - No data to display Imaging Review Dg Wrist Complete Left  10/27/2013   CLINICAL DATA:  Injured left wrist.  EXAM: LEFT WRIST - COMPLETE 3+ VIEW  COMPARISON:  None.  FINDINGS: The joint spaces are maintained.  No acute fracture.  IMPRESSION: No acute bony findings.   Electronically Signed   By: Loralie Champagne M.D.   On: 10/27/2013 19:46   Dg Hand Complete Left  10/27/2013   CLINICAL DATA:  Crossbow injury to left hand.  EXAM: LEFT HAND - COMPLETE 3+ VIEW  COMPARISON:  None.  FINDINGS: No acute fracture is identified. There may be subtle malalignment of the 1st carpometacarpal joint with some degree of subluxation suspected. Associated overlying prominent soft tissue swelling is seen in the thenar/hypothenar region. No soft tissue foreign body is identified.  IMPRESSION: No acute fracture. Suspect subluxation at the 1st carpometacarpal joint. Prominent soft tissue swelling is identified.   Electronically Signed   By: Irish Lack M.D.   On: 10/27/2013 19:48    EKG Interpretation   None       MDM   1. Subluxation of thumb, left, initial encounter    Pt presenting with subluxation of left thumb.  Imaging showed subtle malalignment.  Tx in ED: percocet, ibuprofen and benadryl as percocet makes pt itch.  Thumb reduced by Dr. Jeraldine Loots w/o immediate complications. After reduction pt had full movement of left thumb and reported decreased pain.  Placed pt in Thumb Spica splint.  Advised to f/u with Dr. Merlyn Lot, hand surgery, as pt has been seen by Dr. Merlyn Lot in the past. Return precautions provided. Pt verbalized understanding and agreement with tx plan.   I personally performed the services described in this documentation, which was scribed in my  presence. The recorded information has been reviewed and is accurate.    Junius Finner, PA-C 10/27/13 2103

## 2013-10-28 NOTE — ED Provider Notes (Signed)
  This was a shared visit with a mid-level provided (NP or PA).  Throughout the patient's course I was available for consultation/collaboration.  On my exam the patient was in great discomfort, unwilling to move the thumb secondary to pain.  Refill and sensation more appropriate distally.  We reviewed the x-ray together, x-ray was abnormal for subluxation of the carpometacarpal joint.  Procedure note Indication: Dislocation/subluxation of thumb. Consent verbal After discussion of risks and benefits, the patient endorsed understanding, and we proceeded with the procedure. Analgesia was via oral narcotics and topical ice. With one attempt the thumb was reduced, and the patient subsequently had appropriate flexion and extension capacity throughout the extremity.  Sensation and cap refill remained appropriate.  He was placed in an immobilization device, discharged to follow up with his hand surgeon.     Gerhard Munch, MD 10/28/13 [place] Manners

## 2013-10-29 ENCOUNTER — Encounter (HOSPITAL_BASED_OUTPATIENT_CLINIC_OR_DEPARTMENT_OTHER): Payer: Self-pay | Admitting: *Deleted

## 2013-11-01 ENCOUNTER — Other Ambulatory Visit: Payer: Self-pay | Admitting: Orthopedic Surgery

## 2013-11-02 ENCOUNTER — Ambulatory Visit (HOSPITAL_BASED_OUTPATIENT_CLINIC_OR_DEPARTMENT_OTHER)
Admission: RE | Admit: 2013-11-02 | Discharge: 2013-11-02 | Disposition: A | Discharge status: Home / Self Care | Payer: BC Managed Care – PPO | Source: Ambulatory Visit | Attending: Orthopedic Surgery | Admitting: Orthopedic Surgery

## 2013-11-02 ENCOUNTER — Ambulatory Visit (HOSPITAL_BASED_OUTPATIENT_CLINIC_OR_DEPARTMENT_OTHER): Payer: BC Managed Care – PPO | Admitting: Anesthesiology

## 2013-11-02 ENCOUNTER — Encounter (HOSPITAL_BASED_OUTPATIENT_CLINIC_OR_DEPARTMENT_OTHER)
Admission: RE | Disposition: A | Discharge status: Home / Self Care | Payer: Self-pay | Source: Ambulatory Visit | Attending: Orthopedic Surgery

## 2013-11-02 ENCOUNTER — Encounter (HOSPITAL_BASED_OUTPATIENT_CLINIC_OR_DEPARTMENT_OTHER): Payer: Self-pay | Admitting: Anesthesiology

## 2013-11-02 ENCOUNTER — Encounter (HOSPITAL_BASED_OUTPATIENT_CLINIC_OR_DEPARTMENT_OTHER): Payer: BC Managed Care – PPO | Admitting: Anesthesiology

## 2013-11-02 DIAGNOSIS — Y998 Other external cause status: Secondary | ICD-10-CM | POA: Insufficient documentation

## 2013-11-02 DIAGNOSIS — K219 Gastro-esophageal reflux disease without esophagitis: Secondary | ICD-10-CM | POA: Insufficient documentation

## 2013-11-02 DIAGNOSIS — Y9379 Activity, other specified sports and athletics: Secondary | ICD-10-CM | POA: Insufficient documentation

## 2013-11-02 DIAGNOSIS — X500XXA Overexertion from strenuous movement or load, initial encounter: Secondary | ICD-10-CM | POA: Insufficient documentation

## 2013-11-02 DIAGNOSIS — S63056A Dislocation of other carpometacarpal joint of unspecified hand, initial encounter: Secondary | ICD-10-CM | POA: Insufficient documentation

## 2013-11-02 HISTORY — DX: Dental restoration status: Z98.811

## 2013-11-02 HISTORY — DX: Other complications of anesthesia, initial encounter: T88.59XA

## 2013-11-02 HISTORY — DX: Subluxation of carpometacarpal joint of left thumb, initial encounter: S63.042A

## 2013-11-02 HISTORY — DX: Adverse effect of unspecified anesthetic, initial encounter: T41.45XA

## 2013-11-02 HISTORY — DX: Gastro-esophageal reflux disease without esophagitis: K21.9

## 2013-11-02 HISTORY — PX: LIGAMENT REPAIR: SHX5444

## 2013-11-02 HISTORY — DX: Personal history of urinary calculi: Z87.442

## 2013-11-02 LAB — POCT HEMOGLOBIN-HEMACUE: Hemoglobin: 16.7 g/dL (ref 13.0–17.0)

## 2013-11-02 SURGERY — REPAIR, LIGAMENT
Anesthesia: Regional | Site: Thumb | Laterality: Left | Wound class: Clean

## 2013-11-02 MED ORDER — BUPIVACAINE HCL (PF) 0.25 % IJ SOLN
INTRAMUSCULAR | Status: AC
  Filled 2013-11-02: qty 30

## 2013-11-02 MED ORDER — OXYCODONE-ACETAMINOPHEN 5-325 MG PO TABS: ORAL_TABLET | ORAL | Status: DC

## 2013-11-02 MED ORDER — CHLORHEXIDINE GLUCONATE 4 % EX LIQD: 60.0000 mL | Freq: Once | CUTANEOUS | Status: DC

## 2013-11-02 MED ORDER — FENTANYL CITRATE 0.05 MG/ML IJ SOLN
INTRAMUSCULAR | Status: AC
  Filled 2013-11-02: qty 4

## 2013-11-02 MED ORDER — MIDAZOLAM HCL 2 MG/2ML IJ SOLN
INTRAMUSCULAR | Status: AC
  Filled 2013-11-02: qty 2

## 2013-11-02 MED ORDER — 0.9 % SODIUM CHLORIDE (POUR BTL) OPTIME
TOPICAL | Status: DC | PRN
  Administered 2013-11-02: 300 mL

## 2013-11-02 MED ORDER — DEXAMETHASONE SODIUM PHOSPHATE 10 MG/ML IJ SOLN
INTRAMUSCULAR | Status: DC | PRN
  Administered 2013-11-02: 10 mg via INTRAVENOUS

## 2013-11-02 MED ORDER — BUPIVACAINE HCL (PF) 0.5 % IJ SOLN
INTRAMUSCULAR | Status: DC | PRN
  Administered 2013-11-02: 8 mL

## 2013-11-02 MED ORDER — BUPIVACAINE-EPINEPHRINE PF 0.5-1:200000 % IJ SOLN
INTRAMUSCULAR | Status: DC | PRN
  Administered 2013-11-02: 30 mL

## 2013-11-02 MED ORDER — VANCOMYCIN HCL IN DEXTROSE 1-5 GM/200ML-% IV SOLN
INTRAVENOUS | Status: AC
  Filled 2013-11-02: qty 200

## 2013-11-02 MED ORDER — FENTANYL CITRATE 0.05 MG/ML IJ SOLN
INTRAMUSCULAR | Status: AC
  Filled 2013-11-02: qty 2

## 2013-11-02 MED ORDER — VANCOMYCIN HCL IN DEXTROSE 1-5 GM/200ML-% IV SOLN
1000.0000 mg | INTRAVENOUS | Status: AC
  Administered 2013-11-02: 1000 mg via INTRAVENOUS

## 2013-11-02 MED ORDER — FENTANYL CITRATE 0.05 MG/ML IJ SOLN
50.0000 ug | INTRAMUSCULAR | Status: DC | PRN
  Administered 2013-11-02: 100 ug via INTRAVENOUS

## 2013-11-02 MED ORDER — MIDAZOLAM HCL 2 MG/ML PO SYRP: 12.0000 mg | ORAL_SOLUTION | Freq: Once | ORAL | Status: DC | PRN

## 2013-11-02 MED ORDER — PROPOFOL 10 MG/ML IV BOLUS
INTRAVENOUS | Status: DC | PRN
  Administered 2013-11-02: 200 mg via INTRAVENOUS

## 2013-11-02 MED ORDER — MIDAZOLAM HCL 2 MG/2ML IJ SOLN
1.0000 mg | INTRAMUSCULAR | Status: DC | PRN
  Administered 2013-11-02: 2 mg via INTRAVENOUS

## 2013-11-02 MED ORDER — ONDANSETRON HCL 4 MG/2ML IJ SOLN
INTRAMUSCULAR | Status: DC | PRN
  Administered 2013-11-02: 4 mg via INTRAVENOUS

## 2013-11-02 MED ORDER — LIDOCAINE HCL (CARDIAC) 20 MG/ML IV SOLN
INTRAVENOUS | Status: DC | PRN
  Administered 2013-11-02: 100 mg via INTRAVENOUS

## 2013-11-02 MED ORDER — LACTATED RINGERS IV SOLN
INTRAVENOUS | Status: DC
  Administered 2013-11-02 (×2): via INTRAVENOUS

## 2013-11-02 MED ORDER — HYDROMORPHONE HCL PF 1 MG/ML IJ SOLN: 0.2500 mg | INTRAMUSCULAR | Status: DC | PRN

## 2013-11-02 SURGICAL SUPPLY — 75 items
ANCHOR JUGGERKNOT 1.0 3-0 NLD (Anchor) ×2 IMPLANT
BANDAGE ELASTIC 3 VELCRO ST LF (GAUZE/BANDAGES/DRESSINGS) ×2 IMPLANT
BANDAGE GAUZE ELAST BULKY 4 IN (GAUZE/BANDAGES/DRESSINGS) ×2 IMPLANT
BLADE MINI RND TIP GREEN BEAV (BLADE) ×2 IMPLANT
BLADE SURG 15 STRL LF DISP TIS (BLADE) ×2 IMPLANT
BLADE SURG 15 STRL SS (BLADE) ×2
BNDG ELASTIC 2 VLCR STRL LF (GAUZE/BANDAGES/DRESSINGS) IMPLANT
BNDG ESMARK 4X9 LF (GAUZE/BANDAGES/DRESSINGS) ×2 IMPLANT
CATH ROBINSON RED A/P 8FR (CATHETERS) IMPLANT
CHLORAPREP W/TINT 26ML (MISCELLANEOUS) ×2 IMPLANT
CORDS BIPOLAR (ELECTRODE) ×2 IMPLANT
COVER MAYO STAND STRL (DRAPES) ×2 IMPLANT
COVER TABLE BACK 60X90 (DRAPES) ×2 IMPLANT
CUFF TOURNIQUET SINGLE 18IN (TOURNIQUET CUFF) ×2 IMPLANT
DECANTER SPIKE VIAL GLASS SM (MISCELLANEOUS) IMPLANT
DRAPE EXTREMITY T 121X128X90 (DRAPE) ×2 IMPLANT
DRAPE OEC MINIVIEW 54X84 (DRAPES) ×2 IMPLANT
DRAPE SURG 17X23 STRL (DRAPES) ×2 IMPLANT
DRSG PAD ABDOMINAL 8X10 ST (GAUZE/BANDAGES/DRESSINGS) IMPLANT
GAUZE XEROFORM 1X8 LF (GAUZE/BANDAGES/DRESSINGS) ×2 IMPLANT
GLOVE BIO SURGEON STRL SZ7.5 (GLOVE) ×2 IMPLANT
GLOVE BIOGEL M 7.0 STRL (GLOVE) ×2 IMPLANT
GLOVE BIOGEL PI IND STRL 8 (GLOVE) ×1 IMPLANT
GLOVE BIOGEL PI IND STRL 8.5 (GLOVE) ×1 IMPLANT
GLOVE BIOGEL PI INDICATOR 8 (GLOVE) ×1
GLOVE BIOGEL PI INDICATOR 8.5 (GLOVE) ×1
GLOVE ECLIPSE 6.5 STRL STRAW (GLOVE) ×2 IMPLANT
GLOVE EXAM NITRILE MD LF STRL (GLOVE) ×2 IMPLANT
GLOVE ORTHO TXT STRL SZ7.5 (GLOVE) ×2 IMPLANT
GLOVE SURG ORTHO 8.0 STRL STRW (GLOVE) ×2 IMPLANT
GOWN BRE IMP PREV XXLGXLNG (GOWN DISPOSABLE) ×4 IMPLANT
GOWN PREVENTION PLUS XLARGE (GOWN DISPOSABLE) ×2 IMPLANT
GOWN PREVENTION PLUS XXLARGE (GOWN DISPOSABLE) IMPLANT
K-WIRE .035X4 (WIRE) IMPLANT
K-WIRE .045X4 (WIRE) ×2 IMPLANT
NDL SAFETY ECLIPSE 18X1.5 (NEEDLE) IMPLANT
NEEDLE HYPO 18GX1.5 SHARP (NEEDLE)
NEEDLE HYPO 22GX1.5 SAFETY (NEEDLE) IMPLANT
NEEDLE HYPO 25X1 1.5 SAFETY (NEEDLE) IMPLANT
NEEDLE KEITH (NEEDLE) IMPLANT
NS IRRIG 1000ML POUR BTL (IV SOLUTION) ×2 IMPLANT
PACK BASIN DAY SURGERY FS (CUSTOM PROCEDURE TRAY) ×2 IMPLANT
PAD CAST 3X4 CTTN HI CHSV (CAST SUPPLIES) ×1 IMPLANT
PAD CAST 4YDX4 CTTN HI CHSV (CAST SUPPLIES) IMPLANT
PADDING CAST ABS 4INX4YD NS (CAST SUPPLIES)
PADDING CAST ABS COTTON 4X4 ST (CAST SUPPLIES) IMPLANT
PADDING CAST COTTON 3X4 STRL (CAST SUPPLIES) ×1
PADDING CAST COTTON 4X4 STRL (CAST SUPPLIES)
PASSER SUT SWANSON 36MM LOOP (INSTRUMENTS) IMPLANT
SLEEVE SCD COMPRESS KNEE MED (MISCELLANEOUS) ×2 IMPLANT
SLING ARM FOAM STRAP LRG (SOFTGOODS) ×2 IMPLANT
SPLINT FAST PLASTER 5X30 (CAST SUPPLIES)
SPLINT PLASTER CAST FAST 5X30 (CAST SUPPLIES) IMPLANT
SPLINT PLASTER CAST XFAST 3X15 (CAST SUPPLIES) ×20 IMPLANT
SPLINT PLASTER CAST XFAST 4X15 (CAST SUPPLIES) IMPLANT
SPLINT PLASTER XTRA FAST SET 4 (CAST SUPPLIES)
SPLINT PLASTER XTRA FASTSET 3X (CAST SUPPLIES) ×20
SPONGE GAUZE 4X4 12PLY (GAUZE/BANDAGES/DRESSINGS) ×2 IMPLANT
STAPLER VISISTAT 35W (STAPLE) IMPLANT
STOCKINETTE 4X48 STRL (DRAPES) ×2 IMPLANT
SUT ETHIBOND 3-0 V-5 (SUTURE) IMPLANT
SUT ETHILON 3 0 PS 1 (SUTURE) IMPLANT
SUT ETHILON 4 0 PS 2 18 (SUTURE) ×2 IMPLANT
SUT FIBERWIRE 2-0 18 17.9 3/8 (SUTURE)
SUT MERSILENE 2.0 SH NDLE (SUTURE) IMPLANT
SUT MERSILENE 4 0 P 3 (SUTURE) IMPLANT
SUT MERSILENE 6 0 P 1 (SUTURE) IMPLANT
SUT SILK 4 0 PS 2 (SUTURE) IMPLANT
SUT VIC AB 0 SH 27 (SUTURE) IMPLANT
SUT VICRYL 4-0 PS2 18IN ABS (SUTURE) ×2 IMPLANT
SUTURE FIBERWR 2-0 18 17.9 3/8 (SUTURE) IMPLANT
SYR BULB 3OZ (MISCELLANEOUS) ×2 IMPLANT
SYR CONTROL 10ML LL (SYRINGE) IMPLANT
TOWEL OR 17X24 6PK STRL BLUE (TOWEL DISPOSABLE) ×2 IMPLANT
UNDERPAD 30X30 INCONTINENT (UNDERPADS AND DIAPERS) ×2 IMPLANT

## 2013-11-02 NOTE — Anesthesia Preprocedure Evaluation (Addendum)
Anesthesia Evaluation  Patient identified by MRN, date of birth, ID band Patient awake    Reviewed: Allergy & Precautions, H&P , NPO status , Patient's Chart, lab work & pertinent test results  History of Anesthesia Complications (+) Emergence Delirium and history of anesthetic complications  Airway Mallampati: I TM Distance: >3 FB Neck ROM: Full    Dental no notable dental hx. (+) Teeth Intact and Dental Advisory Given   Pulmonary neg pulmonary ROS,    Pulmonary exam normal       Cardiovascular negative cardio ROS  Rhythm:Regular Rate:Normal     Neuro/Psych negative neurological ROS  negative psych ROS   GI/Hepatic Neg liver ROS, GERD-  Medicated and Controlled,  Endo/Other  negative endocrine ROS  Renal/GU negative Renal ROS  negative genitourinary   Musculoskeletal   Abdominal   Peds  Hematology negative hematology ROS (+)   Anesthesia Other Findings   Reproductive/Obstetrics negative OB ROS                          Anesthesia Physical Anesthesia Plan  ASA: II  Anesthesia Plan: General and Regional   Post-op Pain Management:    Induction: Intravenous  Airway Management Planned: LMA  Additional Equipment:   Intra-op Plan:   Post-operative Plan: Extubation in OR  Informed Consent: I have reviewed the patients History and Physical, chart, labs and discussed the procedure including the risks, benefits and alternatives for the proposed anesthesia with the patient or authorized representative who has indicated his/her understanding and acceptance.   Dental advisory given  Plan Discussed with: CRNA  Anesthesia Plan Comments:         Anesthesia Quick Evaluation

## 2013-11-02 NOTE — Brief Op Note (Signed)
11/02/2013  1:02 PM  PATIENT:  Kyle Yu  37 y.o. male  PRE-OPERATIVE DIAGNOSIS:  LEFT THUMB CARPALMETACARPAL SUBLUXATION  POST-OPERATIVE DIAGNOSIS:  LEFT THUMB CARPALMETACARPAL SUBLUXATION  PROCEDURE:  Procedure(s): LEFT THUMB CARPALMETACARPAL LIGAMENT REPAIR,PINNING CARPALMETACARPAL JOINT (Left)  SURGEON:  Surgeon(s) and Role:    * Tami Ribas, MD - Primary    * Nicki Reaper, MD - Assisting  PHYSICIAN ASSISTANT:   ASSISTANTS: Cindee Salt, MD   ANESTHESIA:   regional and general  EBL:  Total I/O In: 1000 [I.V.:1000] Out: -   BLOOD ADMINISTERED:none  DRAINS: none   LOCAL MEDICATIONS USED:  NONE  SPECIMEN:  No Specimen  DISPOSITION OF SPECIMEN:  N/A  COUNTS:  YES  TOURNIQUET:   Total Tourniquet Time Documented: Upper Arm (Right) - 36 minutes Total: Upper Arm (Right) - 36 minutes   DICTATION: .Other Dictation: Dictation Number [id number]  PLAN OF CARE: Discharge to home after PACU  PATIENT DISPOSITION:  PACU - hemodynamically stable.

## 2013-11-02 NOTE — Transfer of Care (Signed)
Immediate Anesthesia Transfer of Care Note  Patient: Kyle Yu  Procedure(s) Performed: Procedure(s): LEFT THUMB CARPALMETACARPAL LIGAMENT REPAIR,PINNING CARPALMETACARPAL JOINT (Left)  Patient Location: PACU  Anesthesia Type:General and GA combined with regional for post-op pain  Level of Consciousness: sedated  Airway & Oxygen Therapy: Patient Spontanous Breathing and Patient connected to face mask oxygen  Post-op Assessment: Report given to PACU RN and Post -op Vital signs reviewed and stable  Post vital signs: Reviewed and stable  Complications: No apparent anesthesia complications

## 2013-11-02 NOTE — Anesthesia Postprocedure Evaluation (Signed)
  Anesthesia Post-op Note  Patient: Kyle Yu  Procedure(s) Performed: Procedure(s): LEFT THUMB CARPALMETACARPAL LIGAMENT REPAIR,PINNING CARPALMETACARPAL JOINT (Left)  Patient Location: PACU  Anesthesia Type:General and block  Level of Consciousness: awake and alert   Airway and Oxygen Therapy: Patient Spontanous Breathing  Post-op Pain: none  Post-op Assessment: Post-op Vital signs reviewed, Patient's Cardiovascular Status Stable and Respiratory Function Stable  Post-op Vital Signs: Reviewed  Filed Vitals:   11/02/13 1345  BP: 114/66  Pulse: 77  Temp:   Resp: 13    Complications: No apparent anesthesia complications

## 2013-11-02 NOTE — Op Note (Signed)
[id number] 

## 2013-11-02 NOTE — Progress Notes (Signed)
Assisted Dr. Fitzgerald with left, ultrasound guided, supraclavicular block. Side rails up, monitors on throughout procedure. See vital signs in flow sheet. Tolerated Procedure well. 

## 2013-11-02 NOTE — Anesthesia Procedure Notes (Addendum)
Anesthesia Regional Block:  Supraclavicular block  Pre-Anesthetic Checklist: ,, timeout performed, Correct Patient, Correct Site, Correct Laterality, Correct Procedure, Correct Position, site marked, Risks and benefits discussed, pre-op evaluation, post-op pain management  Laterality: Left  Prep: Maximum Sterile Barrier Precautions used and chloraprep       Needles:  Injection technique: Single-shot  Needle Type: Echogenic Stimulator Needle     Needle Length: 5cm 5 cm Needle Gauge: 22 and 22 G    Additional Needles:  Procedures: ultrasound guided (picture in chart) Supraclavicular block Narrative:  Start time: 11/02/2013 11:51 AM End time: 11/02/2013 12:02 PM Injection made incrementally with aspirations every 5 mL. Anesthesiologist: Fitzgerald,MD  Additional Notes: 2% Lidocaine skin wheel. Intercostobrachial block with 8cc of 0.5% Bupivicaine plain.  Supraclavicular block Procedure Name: LMA Insertion Date/Time: 11/02/2013 12:13 PM Performed by: Caren Macadam Pre-anesthesia Checklist: Patient identified, Emergency Drugs available, Suction available and Patient being monitored Patient Re-evaluated:Patient Re-evaluated prior to inductionOxygen Delivery Method: Circle System Utilized Preoxygenation: Pre-oxygenation with 100% oxygen Intubation Type: IV induction Ventilation: Mask ventilation without difficulty LMA: LMA inserted LMA Size: 4.0 Number of attempts: 1 Airway Equipment and Method: bite block Placement Confirmation: positive ETCO2 and breath sounds checked- equal and bilateral Tube secured with: Tape Dental Injury: Teeth and Oropharynx as per pre-operative assessment

## 2013-11-02 NOTE — H&P (Signed)
  Kyle Yu is an 37 y.o. male.   Chief Complaint: left thumb cmc dislocation HPI: 37 yo male states he injured left thumb when crossbow string hit thumb during discharge.  Caused pain and swelling at base of thumb.  Seen at ED where XR showed subluxation of cmc joint of thumb.  Closed reduction by ED staff.  Has noted decreased pain and swelling.  Past Medical History  Diagnosis Date  . GERD (gastroesophageal reflux disease)   . History of kidney stones   . Subluxation of carpometacarpal joint of left thumb 10/2013  . Complication of anesthesia     "mildly aggressive" coming out of anes.  . Dental crown present     Past Surgical History  Procedure Laterality Date  . Vasectomy    . Lumbar laminectomy  2001; 2011 X 2  . Tonsillectomy      History reviewed. No pertinent family history. Social History:  reports that he has never smoked. His smokeless tobacco use includes Snuff. He reports that he does not drink alcohol or use illicit drugs.  Allergies:  Allergies  Allergen Reactions  . Penicillins Anaphylaxis  . Contrast Media [Iodinated Diagnostic Agents] Other (See Comments)    SKIN PEELS  . Morphine And Related Itching  . Percocet [Oxycodone-Acetaminophen] Itching    IF HE TAKES BENADRYL WITH IT, HE CAN TOLERATE IT    No prescriptions prior to admission    No results found for this or any previous visit (from the past 48 hour(s)).  No results found.   A comprehensive review of systems was negative except for: Eyes: positive for contacts/glasses  Height 5\' 11"  (1.803 m), weight 200 lb (90.719 kg).  General appearance: alert, cooperative and appears stated age Head: Normocephalic, without obvious abnormality, atraumatic Neck: supple, symmetrical, trachea midline Resp: clear to auscultation bilaterally Cardio: regular rate and rhythm GI: non tender Extremities: intact sensation and capillary refill all digits.  +epl/fpl/io.  ttp left thumb cmc joint.   +ecchymosis. Pulses: 2+ and symmetric Skin: Skin color, texture, turgor normal. No rashes or lesions Neurologic: Grossly normal Incision/Wound: none  Assessment/Plan Left thumb cmc joint dislocation/subluxation.  Non operative and operative treatment options were discussed with the patient and patient wishes to proceed with operative treatment. Recommend operative exploration and repair of ligament with likely stabilization of joint with k wire.  Risks, benefits, and alternatives of surgery were discussed and the patient agrees with the plan of care.   Myra Weng R 11/02/2013, 9:30 AM

## 2013-11-03 ENCOUNTER — Encounter (HOSPITAL_BASED_OUTPATIENT_CLINIC_OR_DEPARTMENT_OTHER): Payer: Self-pay | Admitting: Orthopedic Surgery

## 2013-11-03 NOTE — Op Note (Signed)
NAMEKELSON, QUEENAN NO.:  [id number]  MEDICAL RECORD NO.:  [id number]  LOCATION:                                 FACILITY:  PHYSICIAN:  Betha Loa, MD        DATE OF BIRTH:  Jul 01, 1976  DATE OF PROCEDURE:  11/02/2013 DATE OF DISCHARGE:                              OPERATIVE REPORT   PREOPERATIVE DIAGNOSIS:  Left thumb carpometacarpal dislocation/subluxation.  POSTOPERATIVE DIAGNOSIS:  Left thumb carpometacarpal dislocation/subluxation.  PROCEDURE:  Left thumb repair dorsal carpometacarpal ligament with APL reconstruction and pinning of carpometacarpal joint.  SURGEON:  Betha Loa, MD  ASSISTANT:  Cindee Salt, MD  ANESTHESIA:  General with regional.  IV FLUIDS:  Per anesthesia flow sheet.  ESTIMATED BLOOD LOSS:  Minimal.  COMPLICATIONS:  None.  SPECIMENS:  None.  TOURNIQUET TIME:  36 minutes.  DISPOSITION:  Stable to PACU.  INDICATIONS:  Mr. Maiorino is a 37 year old male who last week suffered an injury to his left thumb while using his cross bow when he crossed the string and hit the thumb causing a dislocation of the [facility] joint.  He had pain, swelling, and ecchymosis and presented to the emergency department.  Radiographs were taken revealing subluxation.  A closed reduction was performed by the emergency department staff.  He followed up with me in the office.  He noted continued pain at the [facility] joint.  He had some improvement in his pain and swelling.  Radiographs showed subluxation of the joint.  I discussed with Mr. Dolinski the nature of the condition.  I recommended open exploration of the joint with repair/reconstruction of the dorsal CMC ligament and pinning of the joint.  Risks, benefits and alternatives of surgery were discussed including the risk of blood loss, infection, damage to nerves, vessels, tendons, ligaments, bone, failure of surgery, need for additional surgery, complications with wound healing, continued pain, and  continued subluxation and arthritic change.  He voiced understanding of these risks and elected to proceed.  OPERATIVE COURSE:  After being identified preoperatively by myself, the patient and I agreed upon procedure and site of procedure.  Surgical site was marked.  The risks, benefits, and alternatives of surgery were reviewed and he wished to proceed.  Surgical consent had been signed. He was given IV vancomycin as preoperative antibiotic prophylaxis due to penicillin allergy.  He was  transported to the operating room and placed on the operating room table in supine position with the left upper extremity on arm board.  General anesthesia induced by anesthesiologist.  A regional block had been performed by anesthesia in preoperative holding.  Left upper extremity was prepped and draped in normal sterile orthopedic fashion.  A surgical pause was performed between surgeons, anesthesia, and operating room staff, and all were in agreement as to the patient, procedure, and site of procedure. Tourniquet at the proximal aspect of the extremity was inflated to 250 mmHg after exsanguination of the limb with Esmarch bandage.  Incision was made dorsally at the thumb [facility] joint.  This was carried into subcutaneous tissues by spreading technique.  Bipolar electrocautery was used to obtain hemostasis.  Care was taken to watch for any branches of  the dorsal branch of the radial nerve.  The capsule of the [facility] was identified and incised.  The joint was inspected.  There was degenerative change dorsally.  The joint was loose and would subluxate dorsally and radially.  The dorsal ligament was patulous and loose.  It was felt that a repair of this with a suture anchor into the trapezium would be appropriate.  A 0.045-inch K-wire was advanced from distally across the [facility] joint to stabilize the joint in a reduced position.  C- arm was used in AP and lateral projections to ensure appropriate reduction and  position of hardware which was the case.  A juggernaut suture anchor was then placed into the trapezium.  The suture anchor was used to repair and imbricate the dorsal ligament.  A slip of APL tendon was also taken and detached proximally and passed into the joint to augment the repair.  The capsule was repaired back over top of the repair using 4-0 Vicryl suture in a running fashion.  The wound was copiously irrigated with sterile saline.  It was then closed with 4-0 nylon in a horizontal mattress fashion.  A pin was bent and cut short. The wound and pin sites were dressed with sterile Xeroform, 4x4s, and wrapped with a Kerlix bandage.  A thumb spica splint was placed and wrapped with Kerlix and Ace bandage.  Tourniquet was deflated at 36 minutes.  Fingertips were pink with brisk capillary refill after deflation of tourniquet.  The operative drapes were broken down.  The patient was awoken from anesthesia safely.  He was transferred back to stretcher and taken to PACU in stable condition.  I will see him back in the office in 1 week for postoperative followup.  I will give him Percocet 5/325, 1-2 p.o. q.6 hours p.r.n. pain, dispensed #40.  He states he has taken these in the past and as long as he takes some Benadryl he does not have itching.     Betha Loa, MD     KK/MEDQ  D:  11/02/2013  T:  11/03/2013  Job:  [id number]

## 2014-01-25 DIAGNOSIS — IMO0001 Reserved for inherently not codable concepts without codable children: Secondary | ICD-10-CM | POA: Insufficient documentation

## 2014-01-25 DIAGNOSIS — M5137 Other intervertebral disc degeneration, lumbosacral region: Secondary | ICD-10-CM | POA: Insufficient documentation

## 2014-01-25 DIAGNOSIS — M51379 Other intervertebral disc degeneration, lumbosacral region without mention of lumbar back pain or lower extremity pain: Secondary | ICD-10-CM | POA: Insufficient documentation

## 2014-01-25 DIAGNOSIS — M961 Postlaminectomy syndrome, not elsewhere classified: Secondary | ICD-10-CM | POA: Insufficient documentation

## 2014-01-25 HISTORY — DX: Reserved for inherently not codable concepts without codable children: IMO0001

## 2014-02-28 ENCOUNTER — Other Ambulatory Visit: Payer: Self-pay | Admitting: Internal Medicine

## 2014-07-05 ENCOUNTER — Ambulatory Visit (INDEPENDENT_AMBULATORY_CARE_PROVIDER_SITE_OTHER): Payer: BC Managed Care – PPO | Admitting: Internal Medicine

## 2014-07-05 ENCOUNTER — Encounter: Payer: Self-pay | Admitting: Internal Medicine

## 2014-07-05 VITALS — BP 108/70 | HR 56 | Temp 98.0°F | Resp 20 | Ht 71.0 in | Wt 185.0 lb

## 2014-07-05 DIAGNOSIS — E7800 Pure hypercholesterolemia, unspecified: Secondary | ICD-10-CM

## 2014-07-05 DIAGNOSIS — E039 Hypothyroidism, unspecified: Secondary | ICD-10-CM | POA: Insufficient documentation

## 2014-07-05 DIAGNOSIS — Z87442 Personal history of urinary calculi: Secondary | ICD-10-CM

## 2014-07-05 LAB — LIPID PANEL
Cholesterol: 240 mg/dL — ABNORMAL HIGH (ref 0–200)
HDL: 34 mg/dL — ABNORMAL LOW (ref >39.00)
LDL Cholesterol: 177 mg/dL — ABNORMAL HIGH (ref 0–99)
NONHDL: 206
TRIGLYCERIDES: 143 mg/dL (ref 0.0–149.0)
Total CHOL/HDL Ratio: 7
VLDL: 28.6 mg/dL (ref 0.0–40.0)

## 2014-07-05 LAB — SEDIMENTATION RATE: SED RATE: 9 mm/h (ref 0–22)

## 2014-07-05 MED ORDER — ALUMINUM CHLORIDE 20 % EX SOLN: Freq: Every day | CUTANEOUS | Status: DC

## 2014-07-05 NOTE — Patient Instructions (Signed)
Diaphoresis Sweating is controlled by our nervous system. Sweat glands are found in the skin throughout the body. They exist in higher numbers in the skin of the hands, feet, armpits, and the genital region. Sweating occurs normally when the temperature of your body goes up. Diaphoresis means profuse sweating or perspiration due to an underlying medical condition or an external factor (such as medicines). Hyperhidrosis means excessive sweating that is not usually due to an underlying medical condition, on areas such as the palms, soles, or armpits. Other areas of the body may also be affected. Hyperhidrosis usually begins in childhood or early adolescence. It increases in severity through puberty and into adulthood. Sweaty palms are the most common problem and the most bothersome to people who have hyperhidrosis. CAUSES  Sweating is normally seen with exercise or being in hot surroundings. Not sweating in these conditions would be harmful. Stressful situations can also cause sweating. In some people, stimulation of the sweat glands during stress is overactive. Talking to strangers or shaking someone's hand can produce profuse sweating. Causes of sweating include:  Emotional upset.  Low blood pressure.  Low blood sugar.  Heart problems.  Low blood cell counts.  Certain pain relieving medicines.  Exercise.  Alcohol.  Infection.  Caffeine.  Spicy foods.  Hot flashes.  Overactive thyroid.  Illegal drug use, such as cocaine and amphetamine.  Use of medicines that stimulate parts of the nervous system.  A tumor (pheochromocytoma).  Withdrawal from some medicines or alcohol. DIAGNOSIS  Your caregiver needs to be consulted to make sure excessive sweating is not caused by another condition. Further testing may need to be done. TREATMENT   When hyperhidrosis is caused by another condition, that condition should be treated.  If menopause is the cause, you may wish to talk to your  caregiver about estrogen replacement.  If the hyperhidrosis is a natural happening of the way your body works, certain antiperspirants may help.  If medicines do not work, injections of botulinum toxin type A are sometimes used for underarm sweating.  Your caregiver can usually help you with this problem. Document Released: 06/24/2005 Document Revised: 02/24/2012 Document Reviewed: 01/09/2009 [facility] Patient Information 2015 [place], [place]. This information is not intended to replace advice given to you by your health care provider. Make sure you discuss any questions you have with your health care provider. Underarm, Excessive Sweating The medical name for excessive sweating under the arms is primary axillary hyperhidrosis. This condition can interfere with regular social interaction. It can have emotional and professional consequences for some people. Botulinum Toxin Type A (Botox) is an approved treatment for severe underarm sweating. This is used only when the problem can not be managed by prescription antiperspirants. Botox is approved for several other purposes. Botox has also been shown to help with excessive sweating of the palms. BEFORE THE PROCEDURE  Before being treated for excessive sweating, patients should be checked for other possible causes of the problem. This avoids treating a symptom which could be caused by some other serious disease that needs a different treatment. PROCEDURE  Botox is a protein produced by the germ (bacterium) Clostridium botulinum. Small doses are injected under the arms to control sweating. These injections stop the release of acetylcholine. Acetylcholine is a Engineer, manufacturing which makes you sweat. These injections temporarily block the nerves in the underarm from producing this messenger. The reduction in sweating may last for an average of 7 months and up to 16 months after one injection. RISK AND  COMPLICATIONS The most common bad reactions  are:  Injection site pain and bleeding.  Sweating in other parts of the body.  Flu-like symptoms.  Headache.  Fever.  Itching.  Anxiety. Rarely, it can cause a generalized paralysis.  The safety and effectiveness of Botox for excessive sweating in other body areas is not known. Botox is a prescription drug. It must be used carefully under medical supervision. It should be used only for approved indications.  Document Released: 01/31/2006 Document Revised: 02/24/2012 Document Reviewed: 12/07/2008 [facility] Patient Information 2015 [place], [place]. This information is not intended to replace advice given to you by your health care provider. Make sure you discuss any questions you have with your health care provider.

## 2014-07-05 NOTE — Progress Notes (Signed)
Pre visit review using our clinic review tool, if applicable. No additional management support is needed unless otherwise documented below in the visit note. 

## 2014-07-05 NOTE — Progress Notes (Signed)
Subjective:    Patient ID: Kyle Yu, male    DOB: [birth year], 38 y.o.   MRN: [id number]  HPI  38 year old patient who is seen today for followup.  He has a prior history of hypercholesterolemia and has been on atorvastatin.  He has been off medications for 4-5 months and 2 months ago lipid profile was apparently normal with a total cholesterol of less than 200. He has been given a diagnosis of bipolar depression, but has not been on any medication. His only medication at present time is Lyrica do to a chronic low back pain. He is concerned about thyroid dysfunction.  He complains of excessive sweating, heat intolerance, and weight loss, as well as fatigue.  No family history of thyroid dysfunction  Past Medical History  Diagnosis Date  . GERD (gastroesophageal reflux disease)   . History of kidney stones   . Subluxation of carpometacarpal joint of left thumb 10/2013  . Complication of anesthesia     "mildly aggressive" coming out of anes.  . Dental crown present     History   Social History  . Marital Status: Married    Spouse Name: N/A    Number of Children: N/A  . Years of Education: N/A   Occupational History  . Not on file.   Social History Main Topics  . Smoking status: Never Smoker   . Smokeless tobacco: Current User    Types: Snuff  . Alcohol Use: No  . Drug Use: No  . Sexual Activity: Not on file   Other Topics Concern  . Not on file   Social History Narrative  . No narrative on file    Past Surgical History  Procedure Laterality Date  . Vasectomy    . Lumbar laminectomy  2001; 2011 X 2  . Tonsillectomy    . Ligament repair Left 11/02/2013    Procedure: LEFT THUMB CARPALMETACARPAL LIGAMENT REPAIR,PINNING CARPALMETACARPAL JOINT;  Surgeon: Tami RibasKevin R Kuzma, MD;  Location: [facility];  Service: Orthopedics;  Laterality: Left;    No family history on file.  Allergies  Allergen Reactions  . Penicillins Anaphylaxis  . Contrast Media  [Iodinated Diagnostic Agents] Other (See Comments)    SKIN PEELS  . Morphine And Related Itching  . Percocet [Oxycodone-Acetaminophen] Itching    IF HE TAKES BENADRYL WITH IT, HE CAN TOLERATE IT    Current Outpatient Prescriptions on File Prior to Visit  Medication Sig Dispense Refill  . esomeprazole (NEXIUM) 20 MG capsule Take 20 mg by mouth daily at 12 noon.      . pregabalin (LYRICA) 75 MG capsule Take 75 mg by mouth daily. For back pain.      Marland Kitchen. atorvastatin (LIPITOR) 80 MG tablet TAKE 1 TABLET ONCE DAILY.  90 tablet  0   No current facility-administered medications on file prior to visit.    BP 108/70  Pulse 56  Temp(Src) 98 F (36.7 C) (Oral)  Resp 20  Ht 5\' 11"  (1.803 m)  Wt 185 lb (83.915 kg)  BMI 25.81 kg/m2  SpO2 99%      Review of Systems  Constitutional: Positive for fatigue. Negative for fever, chills and appetite change.  HENT: Negative for congestion, dental problem, ear pain, hearing loss, sore throat, tinnitus, trouble swallowing and voice change.   Eyes: Negative for pain, discharge and visual disturbance.  Respiratory: Negative for cough, chest tightness, wheezing and stridor.   Cardiovascular: Negative for chest pain, palpitations and leg swelling.  Gastrointestinal:  Negative for nausea, vomiting, abdominal pain, diarrhea, constipation, blood in stool and abdominal distention.  Genitourinary: Negative for urgency, hematuria, flank pain, discharge, difficulty urinating and genital sores.  Musculoskeletal: Negative for arthralgias, back pain, gait problem, joint swelling, myalgias and neck stiffness.  Skin: Negative for rash.  Neurological: Negative for dizziness, syncope, speech difficulty, weakness, numbness and headaches.  Hematological: Negative for adenopathy. Does not bruise/bleed easily.  Psychiatric/Behavioral: Negative for behavioral problems and dysphoric mood. The patient is not nervous/anxious.        Objective:   Physical Exam    Constitutional: He is oriented to person, place, and time. He appears well-developed.  HENT:  Head: Normocephalic.  Right Ear: External ear normal.  Left Ear: External ear normal.  Eyes: Conjunctivae and EOM are normal.  Neck: Normal range of motion. No thyromegaly present.  Cardiovascular: Normal rate and normal heart sounds.   Pulse rate 60  Pulmonary/Chest: Breath sounds normal.  Abdominal: Bowel sounds are normal.  Musculoskeletal: Normal range of motion. He exhibits no edema and no tenderness.  Neurological: He is alert and oriented to person, place, and time.  No tremor Normal reflexes  Psychiatric: He has a normal mood and affect. His behavior is normal.          Assessment & Plan:   Clinically, euthyroid.  We'll check thyroid function studies History of dyslipidemia.  We'll check a lipid profile off medication

## 2014-07-06 ENCOUNTER — Telehealth: Payer: Self-pay | Admitting: Internal Medicine

## 2014-07-06 LAB — T4, FREE: Free T4: 1.2 ng/dL (ref 0.60–1.60)

## 2014-07-06 LAB — TSH: TSH: 0.05 u[IU]/mL — AB (ref 0.35–4.50)

## 2014-07-06 NOTE — Telephone Encounter (Signed)
This came to me, I am thinking she would need labs done so I would need an order. Can you confirm I need to set up an appointment to check my thyroid levels. For several years I have been trying to figure out what has been causing some of the symptoms I have been having. Depression (tried multiple meds none worked), mood swings (thought bi polar took meds no change), exhausted all the time (body seems to crash around noon and I exercise 3-5 times per week), joints and nerves hurt all the time with excessive cramping in joints, and the most recent is excessive sweating. Constantly hot and can't seem to be comfortable. This was the trigger that made me research this and found that I may be hyperthyroidism. I have stopped taking all meds except 1 lyrica 75 mg at night.

## 2014-07-06 NOTE — Telephone Encounter (Signed)
Noted, pt was seen yesterday and labs ordered.

## 2014-07-07 ENCOUNTER — Other Ambulatory Visit: Payer: Self-pay | Admitting: Internal Medicine

## 2014-07-07 MED ORDER — PROPRANOLOL HCL ER 80 MG PO CP24: 80.0000 mg | ORAL_CAPSULE | Freq: Every day | ORAL | Status: DC

## 2014-07-12 ENCOUNTER — Encounter: Payer: Self-pay | Admitting: Internal Medicine

## 2014-07-13 ENCOUNTER — Ambulatory Visit (INDEPENDENT_AMBULATORY_CARE_PROVIDER_SITE_OTHER): Payer: BC Managed Care – PPO | Admitting: Podiatry

## 2014-07-13 ENCOUNTER — Ambulatory Visit (INDEPENDENT_AMBULATORY_CARE_PROVIDER_SITE_OTHER): Payer: BC Managed Care – PPO

## 2014-07-13 ENCOUNTER — Encounter: Payer: Self-pay | Admitting: Podiatry

## 2014-07-13 VITALS — BP 134/90 | HR 60 | Resp 16 | Ht 71.0 in | Wt 175.0 lb

## 2014-07-13 DIAGNOSIS — M779 Enthesopathy, unspecified: Secondary | ICD-10-CM

## 2014-07-13 DIAGNOSIS — M722 Plantar fascial fibromatosis: Secondary | ICD-10-CM | POA: Diagnosis not present

## 2014-07-13 DIAGNOSIS — M948X9 Other specified disorders of cartilage, unspecified sites: Secondary | ICD-10-CM | POA: Diagnosis not present

## 2014-07-13 MED ORDER — TRIAMCINOLONE ACETONIDE 10 MG/ML IJ SUSP
10.0000 mg | Freq: Once | INTRAMUSCULAR | Status: AC
  Administered 2014-07-13: 10 mg

## 2014-07-13 MED ORDER — DICLOFENAC SODIUM 75 MG PO TBEC: 75.0000 mg | DELAYED_RELEASE_TABLET | Freq: Two times a day (BID) | ORAL | Status: DC

## 2014-07-13 NOTE — Progress Notes (Signed)
   Subjective:    Patient ID: Kyle Yu, male    DOB: February 02, 1976, 38 y.o.   MRN: [id number]  HPI Comments: "Its hurting underneath my foot"  Patient c/o aching sub 1st MPJ left for several months. He is very active in running-training for some races recently. He notices that when he runs and looking at the wear on his shoes, puts more pressure to one side. Does have some back trouble as well. Some arch pain.Tried massaging and Ibuprofen.  Foot Pain Associated symptoms include arthralgias and myalgias.      Review of Systems  Musculoskeletal: Positive for arthralgias, back pain, gait problem and myalgias.  All other systems reviewed and are negative.      Objective:   Physical Exam        Assessment & Plan:

## 2014-07-18 ENCOUNTER — Telehealth: Payer: Self-pay | Admitting: Internal Medicine

## 2014-07-18 ENCOUNTER — Encounter: Payer: Self-pay | Admitting: Internal Medicine

## 2014-07-18 ENCOUNTER — Ambulatory Visit: Payer: BC Managed Care – PPO | Admitting: Internal Medicine

## 2014-07-18 DIAGNOSIS — E039 Hypothyroidism, unspecified: Secondary | ICD-10-CM

## 2014-07-18 NOTE — Telephone Encounter (Signed)
Please advise if okay to send referral? 

## 2014-07-18 NOTE — Telephone Encounter (Signed)
ok 

## 2014-07-18 NOTE — Telephone Encounter (Signed)
Spoke to pt, told him order for referral to requesting provider was sent and someone will get in touch with him regarding appointment. Pt verbalized understanding.

## 2014-07-18 NOTE — Telephone Encounter (Signed)
Pt's wife calling on behalf of pt to request referral to  Dr. Russella DarMicheal Altheimer, tel# [phone], for dx of hypothyroidism.

## 2014-07-19 NOTE — Progress Notes (Signed)
Subjective:     Patient ID: Kyle Yu, male   DOB: 06-Mar-1976, 38 y.o.   MRN: [id number]  Foot Pain   patient states that he's been having trouble with his big toe joint left and it's been hurting him for several months and making running difficult   Review of Systems  All other systems reviewed and are negative.      Objective:   Physical Exam  Nursing note and vitals reviewed. Constitutional: He is oriented to person, place, and time.  Cardiovascular: Intact distal pulses.   Musculoskeletal: Normal range of motion.  Neurological: He is oriented to person, place, and time.  Skin: Skin is warm.   neurovascular status intact with muscle strength adequate and range of motion of the subtalar and midtarsal joint within normal limits. Patient does have discomfort in the first MPJ left with inflammation and no crepitus of the joint noted. 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 Today I did a careful injection around the joint reduced inflammatory changes and explained long-term orthotics that may be necessary. Reappoint to recheck in 2 weeks

## 2014-07-27 ENCOUNTER — Ambulatory Visit: Payer: BC Managed Care – PPO | Admitting: Podiatry

## 2014-07-27 ENCOUNTER — Ambulatory Visit (INDEPENDENT_AMBULATORY_CARE_PROVIDER_SITE_OTHER): Payer: BC Managed Care – PPO | Admitting: Podiatry

## 2014-07-27 ENCOUNTER — Encounter: Payer: Self-pay | Admitting: Podiatry

## 2014-07-27 VITALS — BP 101/72 | HR 81 | Resp 16

## 2014-07-27 DIAGNOSIS — M2022 Hallux rigidus, left foot: Secondary | ICD-10-CM

## 2014-07-27 DIAGNOSIS — M779 Enthesopathy, unspecified: Secondary | ICD-10-CM

## 2014-07-27 DIAGNOSIS — M2020 Hallux rigidus, unspecified foot: Secondary | ICD-10-CM

## 2014-07-27 NOTE — Progress Notes (Signed)
Subjective:     Patient ID: Kyle Yu MentionLonnie A Galindo, male   DOB: [birth year], 38 y.o.   MRN: [id number]  HPI patient presents stating I am doing better but I'm still noticing discomfort and it seems like the last couple days I've noticed it more. I brought [employer] tennis shoes and those seem to be helping my running   Review of Systems     Objective:   Physical Exam Neurovascular status intact with discomfort on the plantar aspect first metatarsal left and at this time no discomfort in the dorsal joint surface or the lateral joint surface    Assessment:     Appears to be improving with the possibility for inflammation around the first metatarsal head left plantar versus functional hallux limitus    Plan:     H&P performed and I explained condition and at this time scanned orthotics to reduce pressure against the first metatarsal head with possibility that future that I may and graphite wedge for the left hallux. Reappoint when orthotics returned

## 2014-08-05 LAB — HEPATIC FUNCTION PANEL
AST: 77 U/L — AB (ref 14–40)
Alkaline Phosphatase: 98 U/L (ref 25–125)
Bilirubin, Total: 0.4 mg/dL

## 2014-08-05 LAB — CBC AND DIFFERENTIAL
HCT: 43 % (ref 41–53)
Hemoglobin: 15.2 g/dL (ref 13.5–17.5)
PLATELETS: 302 10*3/uL (ref 150–399)
WBC: 6.8 10*3/mL

## 2014-08-05 LAB — BASIC METABOLIC PANEL
BUN: 11 mg/dL (ref 4–21)
Creatinine: 1 mg/dL (ref 0.6–1.3)
Glucose: 103 mg/dL
Potassium: 4.1 mmol/L (ref 3.4–5.3)
Sodium: 140 mmol/L (ref 137–147)

## 2014-08-05 LAB — TSH: TSH: 0.83 u[IU]/mL (ref 0.41–5.90)

## 2014-08-19 ENCOUNTER — Ambulatory Visit: Payer: BC Managed Care – PPO

## 2014-08-19 DIAGNOSIS — M779 Enthesopathy, unspecified: Secondary | ICD-10-CM

## 2014-08-19 NOTE — Progress Notes (Signed)
PT is here to PUO 

## 2014-08-19 NOTE — Patient Instructions (Signed)

## 2014-09-07 ENCOUNTER — Encounter: Payer: Self-pay | Admitting: Internal Medicine

## 2014-09-19 ENCOUNTER — Ambulatory Visit: Payer: BC Managed Care – PPO | Admitting: Internal Medicine

## 2014-09-29 ENCOUNTER — Encounter: Payer: Self-pay | Admitting: Podiatry

## 2014-09-29 ENCOUNTER — Ambulatory Visit (INDEPENDENT_AMBULATORY_CARE_PROVIDER_SITE_OTHER): Payer: BC Managed Care – PPO | Admitting: Podiatry

## 2014-09-29 VITALS — BP 119/77 | HR 77 | Resp 16

## 2014-09-29 DIAGNOSIS — M779 Enthesopathy, unspecified: Secondary | ICD-10-CM

## 2014-09-29 MED ORDER — TRIAMCINOLONE ACETONIDE 10 MG/ML IJ SUSP
10.0000 mg | Freq: Once | INTRAMUSCULAR | Status: AC
  Administered 2014-09-29: 10 mg

## 2014-09-29 NOTE — Progress Notes (Signed)
Subjective:     Patient ID: Ernestene MentionLonnie A Becht, male   DOB: [birth year], 38 y.o.   MRN: [id number]  HPIpatient states that the area we worked on is doing fine but he is having some pain in his midfoot left and is getting ready to do a race where he will be running 14 miles in November   Review of Systems     Objective:   Physical Exam Neurovascular status intact with muscle strength adequate and noted to have pain around the anterior tibial insertion left with no indication of muscle strength loss or weakness    Assessment:     Tendinitis of the left anterior tibial insertion which may be do to change in gait from the pain in the first metatarsal creating an inflammatory cycle    Plan:     Careful sheath injection anterior tibial tendon 3 mg Kenalog 5 mg Xylocaine and advised on orthotic usage and that I want to see his orthotics when he returns. Patient will utilize ice and will be seen back and may require cast immobilization if symptoms persist

## 2014-10-13 ENCOUNTER — Ambulatory Visit: Payer: BC Managed Care – PPO | Admitting: Podiatry

## 2014-10-18 ENCOUNTER — Ambulatory Visit: Payer: BC Managed Care – PPO | Admitting: Internal Medicine

## 2016-09-24 ENCOUNTER — Encounter: Payer: Self-pay | Admitting: Internal Medicine

## 2016-09-24 LAB — HM COLONOSCOPY

## 2016-10-09 DIAGNOSIS — K649 Unspecified hemorrhoids: Secondary | ICD-10-CM | POA: Insufficient documentation

## 2017-03-12 ENCOUNTER — Other Ambulatory Visit: Payer: Self-pay | Admitting: Orthopaedic Surgery

## 2017-03-12 DIAGNOSIS — M545 Low back pain: Principal | ICD-10-CM

## 2017-03-12 DIAGNOSIS — G8929 Other chronic pain: Secondary | ICD-10-CM

## 2017-03-27 ENCOUNTER — Ambulatory Visit (INDEPENDENT_AMBULATORY_CARE_PROVIDER_SITE_OTHER): Payer: Self-pay | Admitting: Internal Medicine

## 2017-03-27 ENCOUNTER — Encounter: Payer: Self-pay | Admitting: Internal Medicine

## 2017-03-27 VITALS — BP 112/72 | HR 73 | Temp 98.0°F | Ht 71.0 in | Wt 208.4 lb

## 2017-03-27 DIAGNOSIS — E038 Other specified hypothyroidism: Secondary | ICD-10-CM

## 2017-03-27 DIAGNOSIS — E781 Pure hyperglyceridemia: Secondary | ICD-10-CM | POA: Insufficient documentation

## 2017-03-27 NOTE — Progress Notes (Signed)
Pre visit review using our clinic review tool, if applicable. No additional management support is needed unless otherwise documented below in the visit note. 

## 2017-03-27 NOTE — Progress Notes (Signed)
Subjective:    Patient ID: Kyle Yu, male    DOB: [birth year], 41 y.o.   MRN: [id number]  HPI 41 year old patient who has a history of hypertriglyceridemia.  He had a recent insurance physical with a total cholesterol of 244, HDL cholesterol 44.  LDL cholesterol was not calculated due to triglyceride BM over 400.  Triglycerides were 451.  He had mild elevation in his transaminase hepatitis C antibody was negative. He was treated in the past with statin therapy when total cholesterol was in excess of 400.  This likely was most related to high VDRL cholesterol.  He did have some muscle pain, and statin therapy was discontinued. He has had some weight gain over the years and present weight is now 208.  His activity level has also fallen off. He does also have a history of hypothyroidism, at least intermittently.  This was not checked at the time of his insurance lab  Wt Readings from Last 3 Encounters:  03/27/17 208 lb 6.4 oz (94.5 kg)  07/13/14 175 lb (79.4 kg)  07/05/14 185 lb (83.9 kg)    Past Medical History:  Diagnosis Date  . Complication of anesthesia    "mildly aggressive" coming out of anes.  . Dental crown present   . GERD (gastroesophageal reflux disease)   . History of kidney stones   . Subluxation of carpometacarpal joint of left thumb 10/2013     Social History   Social History  . Marital status: Married    Spouse name: N/A  . Number of children: N/A  . Years of education: N/A   Occupational History  . Not on file.   Social History Main Topics  . Smoking status: Never Smoker  . Smokeless tobacco: Current User    Types: Snuff  . Alcohol use No  . Drug use: No  . Sexual activity: Not on file   Other Topics Concern  . Not on file   Social History Narrative  . No narrative on file    Past Surgical History:  Procedure Laterality Date  . LIGAMENT REPAIR Left 11/02/2013   Procedure: LEFT THUMB CARPALMETACARPAL LIGAMENT REPAIR,PINNING  CARPALMETACARPAL JOINT;  Surgeon: Tami Ribas, MD;  Location: [facility];  Service: Orthopedics;  Laterality: Left;  . LUMBAR LAMINECTOMY  2001; 2011 X 2  . TONSILLECTOMY    . VASECTOMY      No family history on file.  Allergies  Allergen Reactions  . Penicillins Anaphylaxis  . Contrast Media [Iodinated Diagnostic Agents] Other (See Comments)    SKIN PEELS  . Morphine And Related Itching  . Percocet [Oxycodone-Acetaminophen] Itching    IF HE TAKES BENADRYL WITH IT, HE CAN TOLERATE IT    No current outpatient prescriptions on file prior to visit.   No current facility-administered medications on file prior to visit.     BP 112/72 (BP Location: Left Arm, Patient Position: Sitting, Cuff Size: Normal)   Pulse 73   Temp 98 F (36.7 C) (Oral)   Ht  (1.803 m)   Wt 208 lb 6.4 oz (94.5 kg)   SpO2 97%   BMI 29.07 kg/m      Review of Systems  Constitutional: Positive for unexpected weight change. Negative for appetite change, chills, fatigue and fever.  HENT: Negative for congestion, dental problem, ear pain, hearing loss, sore throat, tinnitus, trouble swallowing and voice change.   Eyes: Negative for pain, discharge and visual disturbance.  Respiratory: Negative for cough, chest tightness,  wheezing and stridor.   Cardiovascular: Negative for chest pain, palpitations and leg swelling.  Gastrointestinal: Negative for abdominal distention, abdominal pain, blood in stool, constipation, diarrhea, nausea and vomiting.  Genitourinary: Negative for difficulty urinating, discharge, flank pain, genital sores, hematuria and urgency.  Musculoskeletal: Negative for arthralgias, back pain, gait problem, joint swelling, myalgias and neck stiffness.  Skin: Negative for rash.  Neurological: Negative for dizziness, syncope, speech difficulty, weakness, numbness and headaches.  Hematological: Negative for adenopathy. Does not bruise/bleed easily.  Psychiatric/Behavioral:  Negative for behavioral problems and dysphoric mood. The patient is not nervous/anxious.        Objective:   Physical Exam  Constitutional: He appears well-developed and well-nourished. No distress.  Weight 208 Blood pressure low normal          Assessment & Plan:   Hypertriglyceridemia Mild obesity Nonalcoholic fatty liver disease  All diagnoses discussed at length.  Dietary information dispensed.  He will attempt efforts at weight loss and more activity.  He will consider omega-3 fatty acids or niacin Return in 6 months for follow-up  Rogelia Boga

## 2017-03-27 NOTE — Patient Instructions (Addendum)
Food Choices to Lower Your Triglycerides Triglycerides are a type of fat in your blood. High levels of triglycerides can increase the risk of heart disease and stroke. If your triglyceride levels are high, the foods you eat and your eating habits are very important. Choosing the right foods can help lower your triglycerides. What general guidelines do I need to follow?  Lose weight if you are overweight.  Limit or avoid alcohol.  Fill one half of your plate with vegetables and green salads.  Limit fruit to two servings a day. Choose fruit instead of juice.  Make one fourth of your plate whole grains. Look for the word "whole" as the first word in the ingredient list.  Fill one fourth of your plate with lean protein foods.  Enjoy fatty fish (such as salmon, mackerel, sardines, and tuna) three times a week.  Choose healthy fats.  Limit foods high in starch and sugar.  Eat more home-cooked food and less restaurant, buffet, and fast food.  Limit fried foods.  Cook foods using methods other than frying.  Limit saturated fats.  Check ingredient lists to avoid foods with partially hydrogenated oils (trans fats) in them. What foods can I eat? Grains  Whole grains, such as whole wheat or whole grain breads, crackers, cereals, and pasta. Unsweetened oatmeal, bulgur, barley, quinoa, or brown rice. Corn or whole wheat flour tortillas. Vegetables  Fresh or frozen vegetables (raw, steamed, roasted, or grilled). Green salads. Fruits  All fresh, canned (in natural juice), or frozen fruits. Meat and Other Protein Products  Ground beef (85% or leaner), grass-fed beef, or beef trimmed of fat. Skinless chicken or turkey. Ground chicken or turkey. Pork trimmed of fat. All fish and seafood. Eggs. Dried beans, peas, or lentils. Unsalted nuts or seeds. Unsalted canned or dry beans. Dairy  Low-fat dairy products, such as skim or 1% milk, 2% or reduced-fat cheeses, low-fat ricotta or cottage cheese,  or plain low-fat yogurt. Fats and Oils  Tub margarines without trans fats. Light or reduced-fat mayonnaise and salad dressings. Avocado. Safflower, olive, or canola oils. Natural peanut or almond butter. The items listed above may not be a complete list of recommended foods or beverages. Contact your dietitian for more options.  What foods are not recommended? Grains  White bread. White pasta. White rice. Cornbread. Bagels, pastries, and croissants. Crackers that contain trans fat. Vegetables  White potatoes. Corn. Creamed or fried vegetables. Vegetables in a cheese sauce. Fruits  Dried fruits. Canned fruit in light or heavy syrup. Fruit juice. Meat and Other Protein Products  Fatty cuts of meat. Ribs, chicken wings, bacon, sausage, bologna, salami, chitterlings, fatback, hot dogs, bratwurst, and packaged luncheon meats. Dairy  Whole or 2% milk, cream, half-and-half, and cream cheese. Whole-fat or sweetened yogurt. Full-fat cheeses. Nondairy creamers and whipped toppings. Processed cheese, cheese spreads, or cheese curds. Sweets and Desserts  Corn syrup, sugars, honey, and molasses. Candy. Jam and jelly. Syrup. Sweetened cereals. Cookies, pies, cakes, donuts, muffins, and ice cream. Fats and Oils  Butter, stick margarine, lard, shortening, ghee, or bacon fat. Coconut, palm kernel, or palm oils. Beverages  Alcohol. Sweetened drinks (such as sodas, lemonade, and fruit drinks or punches). The items listed above may not be a complete list of foods and beverages to avoid. Contact your dietitian for more information.  This information is not intended to replace advice given to you by your health care provider. Make sure you discuss any questions you have with your health care provider. Document   Released: 09/19/2004 Document Revised: 05/09/2016 Document Reviewed: 10/06/2013 Elsevier Interactive Patient Education  2017 [employer] Nonalcoholic Fatty Liver Disease Diet Nonalcoholic fatty liver  disease is a condition that causes fat to accumulate in and around the liver. The disease makes it harder for the liver to work the way that it should. Following a healthy diet can help to keep nonalcoholic fatty liver disease under control. It can also help to prevent or improve conditions that are associated with the disease, such as heart disease, diabetes, high blood pressure, and abnormal cholesterol levels. Along with regular exercise, this diet:  Promotes weight loss.  Helps to control blood sugar levels.  Helps to improve the way that the body uses insulin. What do I need to know about this diet?  Use the glycemic index (GI) to plan your meals. The index tells you how quickly a food will raise your blood sugar. Choose low-GI foods. These foods take a longer time to raise blood sugar.  Keep track of how many calories you take in. Eating the right amount of calories will help you to achieve a healthy weight.  You may want to follow a Mediterranean diet. This diet includes a lot of vegetables, lean meats or fish, whole grains, fruits, and healthy oils and fats. What foods can I eat? Grains  Whole grains, such as whole-wheat or whole-grain breads, crackers, tortillas, cereals, and pasta. Stone-ground whole wheat. Pumpernickel bread. Unsweetened oatmeal. Bulgur. Barley. Quinoa. Brown or wild rice. Corn or whole-wheat flour tortillas. Vegetables  Lettuce. Spinach. Peas. Beets. Cauliflower. Cabbage. Broccoli. Carrots. Tomatoes. Squash. Eggplant. Herbs. Peppers. Onions. Cucumbers. Brussels sprouts. Yams and sweet potatoes. Beans. Lentils. Fruits  Bananas. Apples. Oranges. Grapes. Papaya. Mango. Pomegranate. Kiwi. Grapefruit. Cherries. Meats and Other Protein Sources  Seafood and shellfish. Lean meats. Poultry. Tofu. Dairy  Low-fat or fat-free dairy products, such as yogurt, cottage cheese, and cheese. Beverages  Water. Sugar-free drinks. Tea. Coffee. Low-fat or skim milk. Milk alternatives,  such as soy or almond milk. Real fruit juice. Condiments  Mustard. Relish. Low-fat, low-sugar ketchup and barbecue sauce. Low-fat or fat-free mayonnaise. Sweets and Desserts  Sugar-free sweets. Fats and Oils  Avocado. Canola or olive oil. Nuts and nut butters. Seeds. The items listed above may not be a complete list of recommended foods or beverages. Contact your dietitian for more options.  What foods are not recommended? Palm oil and coconut oil. Processed foods. Fried foods. Sweetened drinks, such as sweet tea, milkshakes, snow cones, iced sweet drinks, and sodas. Alcohol. Sweets. Foods that contain a lot of salt or sodium. The items listed above may not be a complete list of foods and beverages to avoid. Contact your dietitian for more information.  This information is not intended to replace advice given to you by your health care provider. Make sure you discuss any questions you have with your health care provider. Document Released: 04/18/2015 Document Revised: 05/09/2016 Document Reviewed: 12/27/2014 Elsevier Interactive Patient Education  2017 [employer] Inc[employer]  Fatty Liver Fatty liver, also called hepatic steatosis or steatohepatitis, is a condition in which too much fat has built up in your liver cells. The liver removes harmful substances from your bloodstream. It produces fluids your body needs. It also helps your body use and store energy from the food you eat. In many cases, fatty liver does not cause symptoms or problems. It is often diagnosed when tests are being done for other reasons. However, over time, fatty liver can cause inflammation that may lead to more  serious liver problems, such as scarring of the liver (cirrhosis). What are the causes? Causes of fatty liver may include:  Drinking too much alcohol.  Poor nutrition.  Obesity.  Cushing syndrome.  Diabetes.  Hyperlipidemia.  Pregnancy.  Certain drugs.  Poisons.  Some viral infections. What increases the  risk? You may be more likely to develop fatty liver if you:  Abuse alcohol.  Are pregnant.  Are overweight.  Have diabetes.  Have hepatitis.  Have a high triglyceride level. What are the signs or symptoms? Fatty liver often does not cause any symptoms. In cases where symptoms develop, they can include:  Fatigue.  Weakness.  Weight loss.  Confusion.  Abdominal pain.  Yellowing of your skin and the white parts of your eyes (jaundice).  Nausea and vomiting. How is this diagnosed? Fatty liver may be diagnosed by:  Physical exam and medical history.  Blood tests.  Imaging tests, such as an ultrasound, CT scan, or MRI.  Liver biopsy. A small sample of liver tissue is removed using a needle. The sample is then looked at under a microscope. How is this treated? Fatty liver is often caused by other health conditions. Treatment for fatty liver may involve medicines and lifestyle changes to manage conditions such as:  Alcoholism.  High cholesterol.  Diabetes.  Being overweight or obese. Follow these instructions at home:  Eat a healthy diet as directed by your health care provider.  Exercise regularly. This can help you lose weight and control your cholesterol and diabetes. Talk to your health care provider about an exercise plan and which activities are best for you.  Do not drink alcohol.  Take medicines only as directed by your health care provider. Contact a health care provider if: You have difficulty controlling your:  Blood sugar.  Cholesterol.  Alcohol consumption. Get help right away if:  You have abdominal pain.  You have jaundice.  You have nausea and vomiting. This information is not intended to replace advice given to you by your health care provider. Make sure you discuss any questions you have with your health care provider. Document Released: 01/17/2006 Document Revised: 05/09/2016 Document Reviewed: 04/13/2014 Elsevier Interactive  Patient Education  2017 [employer].

## 2017-04-11 ENCOUNTER — Ambulatory Visit
Admission: RE | Admit: 2017-04-11 | Discharge: 2017-04-11 | Disposition: A | Discharge status: Home / Self Care | Payer: Self-pay | Source: Ambulatory Visit | Attending: Orthopaedic Surgery | Admitting: Orthopaedic Surgery

## 2017-04-11 ENCOUNTER — Ambulatory Visit
Admission: RE | Admit: 2017-04-11 | Discharge: 2017-04-11 | Disposition: A | Discharge status: Home / Self Care | Payer: No Typology Code available for payment source | Source: Ambulatory Visit | Attending: Orthopaedic Surgery | Admitting: Orthopaedic Surgery

## 2017-04-11 VITALS — BP 127/65 | HR 50

## 2017-04-11 DIAGNOSIS — Z91041 Radiographic dye allergy status: Secondary | ICD-10-CM

## 2017-04-11 DIAGNOSIS — M5137 Other intervertebral disc degeneration, lumbosacral region: Secondary | ICD-10-CM

## 2017-04-11 DIAGNOSIS — M961 Postlaminectomy syndrome, not elsewhere classified: Secondary | ICD-10-CM

## 2017-04-11 DIAGNOSIS — G8929 Other chronic pain: Secondary | ICD-10-CM

## 2017-04-11 DIAGNOSIS — M545 Low back pain: Principal | ICD-10-CM

## 2017-04-11 MED ORDER — METHYLPREDNISOLONE SODIUM SUCC 125 MG IJ SOLR
125.0000 mg | Freq: Once | INTRAMUSCULAR | Status: AC
  Administered 2017-04-11: 125 mg via INTRAMUSCULAR

## 2017-04-11 MED ORDER — DIPHENHYDRAMINE HCL 50 MG PO CAPS
50.0000 mg | ORAL_CAPSULE | Freq: Every day | ORAL | Status: DC
  Administered 2017-04-11: 50 mg via ORAL

## 2017-04-11 MED ORDER — MEPERIDINE HCL 100 MG/ML IJ SOLN
50.0000 mg | Freq: Once | INTRAMUSCULAR | Status: AC
  Administered 2017-04-11: 50 mg via INTRAMUSCULAR

## 2017-04-11 MED ORDER — IOPAMIDOL (ISOVUE-M 200) INJECTION 41%
15.0000 mL | Freq: Once | INTRAMUSCULAR | Status: AC
  Administered 2017-04-11: 15 mL via INTRATHECAL

## 2017-04-11 MED ORDER — ONDANSETRON HCL 4 MG/2ML IJ SOLN
4.0000 mg | Freq: Once | INTRAMUSCULAR | Status: AC
  Administered 2017-04-11: 4 mg via INTRAMUSCULAR

## 2017-04-11 NOTE — Discharge Instructions (Signed)

## 2017-09-02 ENCOUNTER — Ambulatory Visit (INDEPENDENT_AMBULATORY_CARE_PROVIDER_SITE_OTHER): Payer: Self-pay | Admitting: Internal Medicine

## 2017-09-02 ENCOUNTER — Encounter: Payer: Self-pay | Admitting: Internal Medicine

## 2017-09-02 VITALS — BP 112/70 | HR 78 | Temp 98.3°F | Ht 71.0 in | Wt 201.0 lb

## 2017-09-02 DIAGNOSIS — F341 Dysthymic disorder: Secondary | ICD-10-CM

## 2017-09-02 DIAGNOSIS — E039 Hypothyroidism, unspecified: Secondary | ICD-10-CM

## 2017-09-02 MED ORDER — ESCITALOPRAM OXALATE 10 MG PO TABS: 10.0000 mg | ORAL_TABLET | Freq: Every day | ORAL | 1 refills | Status: DC

## 2017-09-02 NOTE — Patient Instructions (Addendum)

## 2017-09-02 NOTE — Progress Notes (Signed)
Subjective:    Patient ID: Kyle Yu, male    DOB: 1976/05/03, 41 y.o.   MRN: [id number]  HPI  Wt Readings from Last 3 Encounters:  09/02/17 201 lb (91.2 kg)  03/27/17 208 lb 6.4 oz (94.5 kg)  07/13/14 175 lb (29.92 kg)   41 year old patient who presents with a chief complaint of anxiety for several years.  He states that his anxiety is causing issues both at work and at home.  He states his job is quite stressful and also works 10-12 hours per day at times and he becomes stressed.  He sometimes "shuts down"and becomes very withdrawn from his family for several days.  At times he feels a secondary depressed.  At times he feels a anger and is concerned about frightening.  The wife and children.  A few years ago, he apparently saw a PA, who was the wife of a Radio broadcast assistant.  According to the patient, she prescribed 80 milligrams of Prozac which caused the patient may be somewhat overstimulated. No history of bipolar disorder.  He has been prescribed Lyrica in th past for chronic pain.  Past Medical History:  Diagnosis Date  . Complication of anesthesia    "mildly aggressive" coming out of anes.  . Dental crown present   . GERD (gastroesophageal reflux disease)   . History of kidney stones   . Subluxation of carpometacarpal joint of left thumb 10/2013     Social History   Social History  . Marital status: Married    Spouse name: N/A  . Number of children: N/A  . Years of education: N/A   Occupational History  . Not on file.   Social History Main Topics  . Smoking status: Never Smoker  . Smokeless tobacco: Current User    Types: Snuff  . Alcohol use No  . Drug use: No  . Sexual activity: Not on file   Other Topics Concern  . Not on file   Social History Narrative  . No narrative on file    Past Surgical History:  Procedure Laterality Date  . LIGAMENT REPAIR Left 11/02/2013   Procedure: LEFT THUMB CARPALMETACARPAL LIGAMENT REPAIR,PINNING CARPALMETACARPAL JOINT;   Surgeon: Tami Ribas, MD;  Location: [facility];  Service: Orthopedics;  Laterality: Left;  . LUMBAR LAMINECTOMY  2001; 2011 X 2  . TONSILLECTOMY    . VASECTOMY      No family history on file.  Allergies  Allergen Reactions  . Penicillins Anaphylaxis  . Contrast Media [Iodinated Diagnostic Agents] Other (See Comments)    Skin peels ("maybe 18 years ago it did it a little bit after a myelogram, then about 10 years ago it happened more;" happens two days later).  Pre-medicated today with Solumedrol 125 mg IM and Benadryl  PO.  Given Rx for dosepack, as well, as reaction seems to occur days after contrast.  Donell Sievert, RN (& Dr. Davonna Belling 04/11/17)   . Morphine And Related Itching  . Percocet [Oxycodone-Acetaminophen] Itching    IF HE TAKES BENADRYL WITH IT, HE CAN TOLERATE IT    No current outpatient prescriptions on file prior to visit.   No current facility-administered medications on file prior to visit.     BP 112/70 (BP Location: Left Arm, Patient Position: Sitting, Cuff Size: Normal)   Pulse 78   Temp 98.3 F (36.8 C) (Oral)   Ht  (1.803 m)   Wt 201 lb (91.2 kg)   SpO2 98%  BMI 28.03 kg/m     Review of Systems  Constitutional: Positive for appetite change.  Psychiatric/Behavioral: Positive for behavioral problems and dysphoric mood. The patient is nervous/anxious.        Objective:   Physical Exam  Constitutional: He is oriented to person, place, and time. He appears well-developed.  HENT:  Head: Normocephalic.  Right Ear: External ear normal.  Left Ear: External ear normal.  Eyes: Conjunctivae and EOM are normal.  Neck: Normal range of motion.  Cardiovascular: Normal rate and normal heart sounds.   Pulmonary/Chest: Breath sounds normal.  Abdominal: Bowel sounds are normal.  Musculoskeletal: Normal range of motion. He exhibits no edema or tenderness.  Neurological: He is alert and oriented to person, place, and time.    Psychiatric: He has a normal mood and affect. His behavior is normal.          Assessment & Plan:   Anxiety disorder, probable  GAD.  Will treat with Lexapro 10 mg daily.  Reassess in 6 weeks.  He will consider behavioral health referral and contact information was dispensed. Doubt patient has bipolar disorder, but a consideration since he seemed to deteriorate on Prozac in the past (but was prescribed a very high initial dose)  Follow-up 6 weeks  Rogelia Boga

## 2017-09-04 ENCOUNTER — Encounter: Payer: Self-pay | Admitting: Internal Medicine

## 2017-09-30 ENCOUNTER — Ambulatory Visit: Payer: Self-pay | Admitting: Internal Medicine

## 2017-10-14 ENCOUNTER — Ambulatory Visit (INDEPENDENT_AMBULATORY_CARE_PROVIDER_SITE_OTHER): Payer: Self-pay | Admitting: Internal Medicine

## 2017-10-14 ENCOUNTER — Encounter: Payer: Self-pay | Admitting: Internal Medicine

## 2017-10-14 VITALS — BP 120/82 | HR 78 | Temp 98.4°F | Ht 71.0 in | Wt 197.8 lb

## 2017-10-14 DIAGNOSIS — F341 Dysthymic disorder: Secondary | ICD-10-CM

## 2017-10-14 MED ORDER — ESCITALOPRAM OXALATE 10 MG PO TABS: 10.0000 mg | ORAL_TABLET | Freq: Every day | ORAL | 3 refills | Status: DC

## 2017-10-14 NOTE — Progress Notes (Signed)
Subjective:    Patient ID: Kyle Yu MentionLonnie A Godby, male    DOB: 27-Aug-1976, 41 y.o.   MRN: [id number]  HPI  41 year old patient who is seen today in follow-up. He has had a history of adjustment disorder with mixed anxiety and depressed mood.  Presently, he has been on Lexapro, which he tolerates well and feels that it has been a great benefit. Work was a significant stressor , but he will starting new employment. He generally feels quite well on present regimen.  Past Medical History:  Diagnosis Date  . Complication of anesthesia    "mildly aggressive" coming out of anes.  . Dental crown present   . GERD (gastroesophageal reflux disease)   . History of kidney stones   . Subluxation of carpometacarpal joint of left thumb 10/2013     Social History   Social History  . Marital status: Married    Spouse name: N/A  . Number of children: N/A  . Years of education: N/A   Occupational History  . Not on file.   Social History Main Topics  . Smoking status: Never Smoker  . Smokeless tobacco: Current User    Types: Snuff  . Alcohol use No  . Drug use: No  . Sexual activity: Not on file   Other Topics Concern  . Not on file   Social History Narrative  . No narrative on file    Past Surgical History:  Procedure Laterality Date  . LIGAMENT REPAIR Left 11/02/2013   Procedure: LEFT THUMB CARPALMETACARPAL LIGAMENT REPAIR,PINNING CARPALMETACARPAL JOINT;  Surgeon: Tami RibasKevin R Kuzma, MD;  Location: [facility];  Service: Orthopedics;  Laterality: Left;  . LUMBAR LAMINECTOMY  2001; 2011 X 2  . TONSILLECTOMY    . VASECTOMY      No family history on file.  Allergies  Allergen Reactions  . Penicillins Anaphylaxis  . Contrast Media [Iodinated Diagnostic Agents] Other (See Comments)    Skin peels ("maybe 18 years ago it did it a little bit after a myelogram, then about 10 years ago it happened more;" happens two days later).  Pre-medicated today with Solumedrol 125 mg IM  and Benadryl 50mg  PO.  Given Rx for dosepack, as well, as reaction seems to occur days after contrast.  Donell SievertJeanne Lohr, RN (& Dr. Davonna BellingJohn Curnes 04/11/17)   . Morphine And Related Itching  . Percocet [Oxycodone-Acetaminophen] Itching    IF HE TAKES BENADRYL WITH IT, HE CAN TOLERATE IT    No current outpatient prescriptions on file prior to visit.   No current facility-administered medications on file prior to visit.     BP 120/82 (BP Location: Left Arm, Patient Position: Sitting, Cuff Size: Normal)   Pulse 78   Temp 98.4 F (36.9 C) (Oral)   Ht 5\' 11"  (1.803 m)   Wt 197 lb 12.8 oz (89.7 kg)   SpO2 98%   BMI 27.59 kg/m     Review of Systems  Constitutional: Negative for appetite change, chills, fatigue and fever.  HENT: Negative for congestion, dental problem, ear pain, hearing loss, sore throat, tinnitus, trouble swallowing and voice change.   Eyes: Negative for pain, discharge and visual disturbance.  Respiratory: Negative for cough, chest tightness, wheezing and stridor.   Cardiovascular: Negative for chest pain, palpitations and leg swelling.  Gastrointestinal: Negative for abdominal distention, abdominal pain, blood in stool, constipation, diarrhea, nausea and vomiting.  Genitourinary: Negative for difficulty urinating, discharge, flank pain, genital sores, hematuria and urgency.  Musculoskeletal: Negative for  arthralgias, back pain, gait problem, joint swelling, myalgias and neck stiffness.  Skin: Negative for rash.  Neurological: Negative for dizziness, syncope, speech difficulty, weakness, numbness and headaches.  Hematological: Negative for adenopathy. Does not bruise/bleed easily.  Psychiatric/Behavioral: Positive for dysphoric mood. Negative for behavioral problems. The patient is nervous/anxious.        Objective:   Physical Exam  Constitutional: He appears well-developed and well-nourished.  HENT:  Head: Normocephalic and atraumatic.  Right Ear: External ear normal.    Left Ear: External ear normal.  Nose: Nose normal.  Mouth/Throat: Oropharynx is clear and moist.  Eyes: Pupils are equal, round, and reactive to light. Conjunctivae and EOM are normal. No scleral icterus.  Neck: Normal range of motion. Neck supple. No JVD present. No thyromegaly present.  Cardiovascular: Regular rhythm, normal heart sounds and intact distal pulses.  Exam reveals no gallop and no friction rub.   No murmur heard. Pulmonary/Chest: Effort normal and breath sounds normal. He exhibits no tenderness.  Abdominal: Soft. Bowel sounds are normal. He exhibits no distension and no mass. There is no tenderness.  Genitourinary: Prostate normal and penis normal.  Musculoskeletal: Normal range of motion. He exhibits no edema or tenderness.  Lymphadenopathy:    He has no cervical adenopathy.  Neurological: He is alert. He has normal reflexes. No cranial nerve deficit. Coordination normal.  Skin: Skin is warm and dry. No rash noted.  Psychiatric: He has a normal mood and affect. His behavior is normal.          Assessment & Plan:   Situational stress with depressed mood. Patient is greatly improved largely in part to quitting.  Prior job, which was quite stressful.  He is optimistic about his new job, although will be taken a significant sudden his salary. No change in medical regimen Follow-up 6 months  Elford Evilsizer Homero Fellers

## 2017-11-18 ENCOUNTER — Ambulatory Visit (INDEPENDENT_AMBULATORY_CARE_PROVIDER_SITE_OTHER): Payer: Self-pay | Admitting: Internal Medicine

## 2017-11-18 ENCOUNTER — Encounter: Payer: Self-pay | Admitting: Internal Medicine

## 2017-11-18 VITALS — BP 120/70 | HR 62 | Temp 97.8°F | Ht 71.0 in | Wt 196.0 lb

## 2017-11-18 DIAGNOSIS — F341 Dysthymic disorder: Secondary | ICD-10-CM

## 2017-11-18 MED ORDER — BUPROPION HCL ER (SR) 150 MG PO TB12: 150.0000 mg | ORAL_TABLET | Freq: Two times a day (BID) | ORAL | 0 refills | Status: DC

## 2017-11-18 MED ORDER — BUPROPION HCL ER (XL) 300 MG PO TB24: 300.0000 mg | ORAL_TABLET | Freq: Every day | ORAL | 4 refills | Status: DC

## 2017-11-18 NOTE — Patient Instructions (Addendum)
Major Depressive Disorder, Adult Major depressive disorder (MDD) is a mental health condition. It may also be called clinical depression or unipolar depression. MDD usually causes feelings of sadness, hopelessness, or helplessness. MDD can also cause physical symptoms. It can interfere with work, school, relationships, and other everyday activities. MDD may be mild, moderate, or severe. It may occur once (single episode major depressive disorder) or it may occur multiple times (recurrent major depressive disorder). What are the causes? The exact cause of this condition is not known. MDD is most likely caused by a combination of things, which may include:  Genetic factors. These are traits that are passed along from parent to child.  Individual factors. Your personality, your behavior, and the way you handle your thoughts and feelings may contribute to MDD. This includes personality traits and behaviors learned from others.  Physical factors, such as: ? Differences in the part of your brain that controls emotion. This part of your brain may be different than it is in people who do not have MDD. ? Long-term (chronic) medical or psychiatric illnesses.  Social factors. Traumatic experiences or major life changes may play a role in the development of MDD.  What increases the risk? This condition is more likely to develop in women. The following factors may also make you more likely to develop MDD:  A family history of depression.  Troubled family relationships.  Abnormally low levels of certain brain chemicals.  Traumatic events in childhood, especially abuse or the loss of a parent.  Being under a lot of stress, or long-term stress, especially from upsetting life experiences or losses.  A history of: ? Chronic physical illness. ? Other mental health disorders. ? Substance abuse.  Poor living conditions.  Experiencing social exclusion or discrimination on a regular basis.  What are  the signs or symptoms? The main symptoms of MDD typically include:  Constant depressed or irritable mood.  Loss of interest in things and activities.  MDD symptoms may also include:  Sleeping or eating too much or too little.  Unexplained weight change.  Fatigue or low energy.  Feelings of worthlessness or guilt.  Difficulty thinking clearly or making decisions.  Thoughts of suicide or of harming others.  Physical agitation or weakness.  Isolation.  Severe cases of MDD may also occur with other symptoms, such as:  Delusions or hallucinations, in which you imagine things that are not real (psychotic depression).  Low-level depression that lasts at least a year (chronic depression or persistent depressive disorder).  Extreme sadness and hopelessness (melancholic depression).  Trouble speaking and moving (catatonic depression).  How is this diagnosed? This condition may be diagnosed based on:  Your symptoms.  Your medical history, including your mental health history. This may involve tests to evaluate your mental health. You may be asked questions about your lifestyle, including any drug and alcohol use, and how long you have had symptoms of MDD.  A physical exam.  Blood tests to rule out other conditions.  You must have a depressed mood and at least four other MDD symptoms most of the day, nearly every day in the same 2-week timeframe before your health care provider can confirm a diagnosis of MDD. How is this treated? This condition is usually treated by mental health professionals, such as psychologists, psychiatrists, and clinical social workers. You may need more than one type of treatment. Treatment may include:  Psychotherapy. This is also called talk therapy or counseling. Types of psychotherapy include: ? Cognitive behavioral   therapy (CBT). This type of therapy teaches you to recognize unhealthy feelings, thoughts, and behaviors, and replace them with  positive thoughts and actions. ? Interpersonal therapy (IPT). This helps you to improve the way you relate to and communicate with others. ? Family therapy. This treatment includes members of your family.  Medicine to treat anxiety and depression, or to help you control certain emotions and behaviors.  Lifestyle changes, such as: ? Limiting alcohol and drug use. ? Exercising regularly. ? Getting plenty of sleep. ? Making healthy eating choices. ? Spending more time outdoors.  Treatments involving stimulation of the brain can be used in situations with extremely severe symptoms, or when medicine or other therapies do not work over time. These treatments include electroconvulsive therapy, transcranial magnetic stimulation, and vagal nerve stimulation. Follow these instructions at home: Activity  Return to your normal activities as told by your health care provider.  Exercise regularly and spend time outdoors as told by your health care provider. General instructions  Take over-the-counter and prescription medicines only as told by your health care provider.  Do not drink alcohol. If you drink alcohol, limit your alcohol intake to no more than 1 drink a day for nonpregnant women and 2 drinks a day for men. One drink equals 12 oz of beer, 5 oz of wine, or 1 oz of hard liquor. Alcohol can affect any antidepressant medicines you are taking. Talk to your health care provider about your alcohol use.  Eat a healthy diet and get plenty of sleep.  Find activities that you enjoy doing, and make time to do them.  Consider joining a support group. Your health care provider may be able to recommend a support group.  Keep all follow-up visits as told by your health care provider. This is important. Where to find more information: National Alliance on Mental Illness  www.nami.org  U.S. National Institute of Mental Health  www.nimh.nih.gov  National Suicide Prevention  Lifeline  1-800-273-TALK (8255). This is free, 24-hour help.  Contact a health care provider if:  Your symptoms get worse.  You develop new symptoms. Get help right away if:  You self-harm.  You have serious thoughts about hurting yourself or others.  You see, hear, taste, smell, or feel things that are not present (hallucinate). This information is not intended to replace advice given to you by your health care provider. Make sure you discuss any questions you have with your health care provider. Document Released: 03/29/2013 Document Revised: 08/08/2016 Document Reviewed: 06/12/2016 Elsevier Interactive Patient Education  2017 Elsevier Inc.  

## 2017-11-18 NOTE — Progress Notes (Signed)
Subjective:    Patient ID: Kyle Yu MentionLonnie A Hurley, male    DOB: 1976-01-06, 41 y.o.   MRN: [id number]  HPI  41 year old patient who is seen today for follow-up of clinical depression. He was last seen about 6 weeks ago and was felt to be doing better on Lexapro.  He has changed jobs which had been a significant stressor and was very optimistic about his new position although it did result in a play reduction. He is accompanied by his wife today He really has done poorly over the past several weeks with worsening depression and little interest in daily activities.  This is interfering with his job as well as interpersonal relationships with children and wife.  His wife is very supportive He has a very strong family history of depression with both parents and siblings affected.   He feels Lexapro has been very helpful in the sense of improving anxiety but feels that his depression otherwise has intensified.  Past Medical History:  Diagnosis Date  . Complication of anesthesia    "mildly aggressive" coming out of anes.  . Dental crown present   . GERD (gastroesophageal reflux disease)   . History of kidney stones   . Subluxation of carpometacarpal joint of left thumb 10/2013     Social History   Socioeconomic History  . Marital status: Married    Spouse name: Not on file  . Number of children: Not on file  . Years of education: Not on file  . Highest education level: Not on file  Social Needs  . Financial resource strain: Not on file  . Food insecurity - worry: Not on file  . Food insecurity - inability: Not on file  . Transportation needs - medical: Not on file  . Transportation needs - non-medical: Not on file  Occupational History  . Not on file  Tobacco Use  . Smoking status: Never Smoker  . Smokeless tobacco: Current User    Types: Snuff  Substance and Sexual Activity  . Alcohol use: No  . Drug use: No  . Sexual activity: Not on file  Other Topics Concern  . Not on file    Social History Narrative  . Not on file    Past Surgical History:  Procedure Laterality Date  . LIGAMENT REPAIR Left 11/02/2013   Procedure: LEFT THUMB CARPALMETACARPAL LIGAMENT REPAIR,PINNING CARPALMETACARPAL JOINT;  Surgeon: Tami RibasKevin R Kuzma, MD;  Location: [facility];  Service: Orthopedics;  Laterality: Left;  . LUMBAR LAMINECTOMY  2001; 2011 X 2  . TONSILLECTOMY    . VASECTOMY      History reviewed. No pertinent family history.  Allergies  Allergen Reactions  . Penicillins Anaphylaxis  . Contrast Media [Iodinated Diagnostic Agents] Other (See Comments)    Skin peels ("maybe 18 years ago it did it a little bit after a myelogram, then about 10 years ago it happened more;" happens two days later).  Pre-medicated today with Solumedrol 125 mg IM and Benadryl 50mg  PO.  Given Rx for dosepack, as well, as reaction seems to occur days after contrast.  Donell SievertJeanne Lohr, RN (& Dr. Davonna BellingJohn Curnes 04/11/17)   . Morphine And Related Itching  . Percocet [Oxycodone-Acetaminophen] Itching    IF HE TAKES BENADRYL WITH IT, HE CAN TOLERATE IT    Current Outpatient Medications on File Prior to Visit  Medication Sig Dispense Refill  . escitalopram (LEXAPRO) 10 MG tablet Take 1 tablet (10 mg total) by mouth daily. 90 tablet 3  No current facility-administered medications on file prior to visit.     BP 120/70 (BP Location: Left Arm, Patient Position: Sitting, Cuff Size: Normal)   Pulse 62   Temp 97.8 F (36.6 C) (Oral)   Ht 5\' 11"  (1.803 m)   Wt 196 lb (88.9 kg)   SpO2 98%   BMI 27.34 kg/m     Review of Systems  Constitutional: Negative for appetite change, chills, fatigue and fever.  HENT: Negative for congestion, dental problem, ear pain, hearing loss, sore throat, tinnitus, trouble swallowing and voice change.   Eyes: Negative for pain, discharge and visual disturbance.  Respiratory: Negative for cough, chest tightness, wheezing and stridor.   Cardiovascular: Negative for chest  pain, palpitations and leg swelling.  Gastrointestinal: Negative for abdominal distention, abdominal pain, blood in stool, constipation, diarrhea, nausea and vomiting.  Genitourinary: Negative for difficulty urinating, discharge, flank pain, genital sores, hematuria and urgency.  Musculoskeletal: Negative for arthralgias, back pain, gait problem, joint swelling, myalgias and neck stiffness.  Skin: Negative for rash.  Neurological: Negative for dizziness, syncope, speech difficulty, weakness, numbness and headaches.  Hematological: Negative for adenopathy. Does not bruise/bleed easily.  Psychiatric/Behavioral: Positive for decreased concentration and dysphoric mood. Negative for behavioral problems and suicidal ideas. The patient is not nervous/anxious.        Objective:   Physical Exam  Constitutional: He appears well-developed and well-nourished. He appears distressed.  Psychiatric: He has a normal mood and affect. His behavior is normal. Judgment and thought content normal.  Depressed mood No suicide ideation          Assessment & Plan:   Major depression recurrence.  Options discussed.  Patient has a high deductible plan and he believes all behavioral health services will be out-of-pocket cost.  He is willing to budget this amount if necessary. We will augment with bupropion and hold off on psychiatric referral at this time.  Follow-up 6 weeks.  He has signed up for "My Chart" and will keep me posted.  He will contact me if he feels that psychiatric referral would be best prior to follow-up  Rogelia BogaKWIATKOWSKI,Tationna Fullard FRANK

## 2017-11-28 ENCOUNTER — Encounter: Payer: Self-pay | Admitting: Internal Medicine

## 2017-12-02 ENCOUNTER — Telehealth: Payer: Self-pay | Admitting: Family Medicine

## 2017-12-02 ENCOUNTER — Other Ambulatory Visit: Payer: Self-pay | Admitting: Internal Medicine

## 2017-12-02 MED ORDER — VENLAFAXINE HCL ER 75 MG PO CP24: 75.0000 mg | ORAL_CAPSULE | Freq: Every day | ORAL | 2 refills | Status: DC

## 2017-12-02 NOTE — Telephone Encounter (Signed)
Called pt at [phone] and left a detailed voice message to call the office back.

## 2017-12-02 NOTE — Telephone Encounter (Signed)
Pt said he missed a call from patty again. Please call him back @ [phone].

## 2017-12-02 NOTE — Telephone Encounter (Signed)
Copied from CRM [id number]. Topic: Quick Communication - See Telephone Encounter >> Dec 02, 2017  1:15 PM Waymon AmatoBurton, Donna F wrote: CRM for notification.pt is wanting to talk with someone about welbutrin and it causing ringing in his ears   Best number [phone] 12/02/17.

## 2017-12-02 NOTE — Telephone Encounter (Signed)
Pt started taking the welbutrin about 2 weeks ago and he began to have ring ing in ears 5-6 days ago.  The ringing is constant and is unbearable when it is quite.  Pt has stopped taking medication and would like to start taking something else Please advise

## 2017-12-03 NOTE — Telephone Encounter (Signed)
Yes patient called; medication changed and filled at pharmacy

## 2017-12-03 NOTE — Telephone Encounter (Signed)
Sr, did you call pt? I only called/spoke with pt once yesterday.  Please advise.

## 2017-12-18 ENCOUNTER — Encounter: Payer: Self-pay | Admitting: Internal Medicine

## 2017-12-30 ENCOUNTER — Ambulatory Visit: Payer: Self-pay | Admitting: Internal Medicine

## 2018-01-12 ENCOUNTER — Encounter: Payer: Self-pay | Admitting: Internal Medicine

## 2018-01-25 ENCOUNTER — Other Ambulatory Visit: Payer: Self-pay | Admitting: Internal Medicine

## 2018-01-26 ENCOUNTER — Telehealth: Payer: Self-pay | Admitting: Internal Medicine

## 2018-01-26 ENCOUNTER — Other Ambulatory Visit: Payer: Self-pay | Admitting: Internal Medicine

## 2018-01-26 MED ORDER — VENLAFAXINE HCL ER 75 MG PO CP24: 75.0000 mg | ORAL_CAPSULE | Freq: Two times a day (BID) | ORAL | 2 refills | Status: DC

## 2018-01-26 NOTE — Telephone Encounter (Signed)
E-scribed new medication to the pharmacy. Called pt and made him aware.

## 2018-01-26 NOTE — Telephone Encounter (Signed)
Noted.  Agree.  Okay to cancel referral

## 2018-01-26 NOTE — Telephone Encounter (Signed)
Spoke with pt about psychiatric referral. Pt stated that he would like hold off on the referral for now. And would like to double up on the Effexor works for a month or two.    Please advise.

## 2018-04-14 ENCOUNTER — Ambulatory Visit: Payer: Self-pay | Admitting: Internal Medicine

## 2018-04-21 ENCOUNTER — Encounter: Payer: Self-pay | Admitting: Internal Medicine

## 2018-04-21 ENCOUNTER — Ambulatory Visit: Payer: Self-pay | Admitting: Internal Medicine

## 2018-04-21 VITALS — BP 118/70 | HR 72 | Temp 98.1°F | Wt 203.0 lb

## 2018-04-21 DIAGNOSIS — F341 Dysthymic disorder: Secondary | ICD-10-CM

## 2018-04-21 MED ORDER — VENLAFAXINE HCL ER 150 MG PO CP24: 150.0000 mg | ORAL_CAPSULE | Freq: Every day | ORAL | 2 refills | Status: DC

## 2018-04-21 NOTE — Progress Notes (Signed)
Subjective:    Patient ID: Kyle Yu, male    DOB: Apr 24, 1976, 42 y.o.   MRN: [id number]  HPI  42 year old patient who is seen today for follow-up of depression. At the present time he is on Effexor extended release 75 mg daily.  He was on 150 mg in divided dosages but discontinued the second daily dose due to some insomnia.  He was taking the second dose with dinner.  He states that he needs to take this medication with a meal or else it is not well-tolerated.  He feels the medicine has been quite helpful with his depression but he still feels anger issues and " is still not where I need to be".  He was intolerant of bupropion  Past Medical History:  Diagnosis Date  . Complication of anesthesia    "mildly aggressive" coming out of anes.  . Dental crown present   . GERD (gastroesophageal reflux disease)   . History of kidney stones   . Subluxation of carpometacarpal joint of left thumb 10/2013     Social History   Socioeconomic History  . Marital status: Married    Spouse name: Not on file  . Number of children: Not on file  . Years of education: Not on file  . Highest education level: Not on file  Occupational History  . Not on file  Social Needs  . Financial resource strain: Not on file  . Food insecurity:    Worry: Not on file    Inability: Not on file  . Transportation needs:    Medical: Not on file    Non-medical: Not on file  Tobacco Use  . Smoking status: Never Smoker  . Smokeless tobacco: Current User    Types: Snuff  Substance and Sexual Activity  . Alcohol use: No  . Drug use: No  . Sexual activity: Not on file  Lifestyle  . Physical activity:    Days per week: Not on file    Minutes per session: Not on file  . Stress: Not on file  Relationships  . Social connections:    Talks on phone: Not on file    Gets together: Not on file    Attends religious service: Not on file    Active member of club or organization: Not on file    Attends meetings  of clubs or organizations: Not on file    Relationship status: Not on file  . Intimate partner violence:    Fear of current or ex partner: Not on file    Emotionally abused: Not on file    Physically abused: Not on file    Forced sexual activity: Not on file  Other Topics Concern  . Not on file  Social History Narrative  . Not on file    Past Surgical History:  Procedure Laterality Date  . LIGAMENT REPAIR Left 11/02/2013   Procedure: LEFT THUMB CARPALMETACARPAL LIGAMENT REPAIR,PINNING CARPALMETACARPAL JOINT;  Surgeon: Tami Ribas, MD;  Location: [facility];  Service: Orthopedics;  Laterality: Left;  . LUMBAR LAMINECTOMY  2001; 2011 X 2  . TONSILLECTOMY    . VASECTOMY      History reviewed. No pertinent family history.  Allergies  Allergen Reactions  . Penicillins Anaphylaxis  . Contrast Media [Iodinated Diagnostic Agents] Other (See Comments)    Skin peels ("maybe 18 years ago it did it a little bit after a myelogram, then about 10 years ago it happened more;" happens two days later).  Pre-medicated today with Solumedrol 125 mg IM and Benadryl  PO.  Given Rx for dosepack, as well, as reaction seems to occur days after contrast.  Donell Sievert, RN (& Dr. Davonna Belling 04/11/17)   . Morphine And Related Itching  . Percocet [Oxycodone-Acetaminophen] Itching    IF HE TAKES BENADRYL WITH IT, HE CAN TOLERATE IT    No current outpatient medications on file prior to visit.   No current facility-administered medications on file prior to visit.     BP 118/70 (BP Location: Right Arm, Patient Position: Sitting, Cuff Size: Large)   Pulse 72   Temp 98.1 F (36.7 C) (Oral)   Wt 203 lb (92.1 kg)   SpO2 98%   BMI 28.31 kg/m     Review of Systems  Psychiatric/Behavioral: Positive for behavioral problems and dysphoric mood.       Objective:   Physical Exam  Constitutional: He appears well-developed and well-nourished. He appears distressed.  Psychiatric: He has  a normal mood and affect. His behavior is normal. Judgment and thought content normal.          Assessment & Plan:   Depression improved.  Patient is on extended release Effexor.  Will increase 250 mg every morning with breakfast.  Return in 3 months for follow-up.  He will consider psychiatric referral if he is not pleased with his progress  Rogelia Boga

## 2018-04-21 NOTE — Patient Instructions (Signed)
Return in 3 months for follow-up  Call or return to clinic prn if these symptoms worsen or fail to improve as anticipated.  

## 2018-04-23 ENCOUNTER — Encounter: Payer: Self-pay | Admitting: Internal Medicine

## 2018-04-27 ENCOUNTER — Encounter: Payer: Self-pay | Admitting: Internal Medicine

## 2018-05-12 ENCOUNTER — Encounter: Payer: Self-pay | Admitting: Internal Medicine

## 2018-05-15 ENCOUNTER — Other Ambulatory Visit: Payer: Self-pay | Admitting: Internal Medicine

## 2018-05-15 DIAGNOSIS — F339 Major depressive disorder, recurrent, unspecified: Secondary | ICD-10-CM

## 2018-07-08 ENCOUNTER — Encounter: Payer: Self-pay | Admitting: Internal Medicine

## 2018-07-20 ENCOUNTER — Encounter: Payer: Self-pay | Admitting: Internal Medicine

## 2018-07-21 ENCOUNTER — Other Ambulatory Visit: Payer: Self-pay | Admitting: Internal Medicine

## 2018-07-21 ENCOUNTER — Other Ambulatory Visit (INDEPENDENT_AMBULATORY_CARE_PROVIDER_SITE_OTHER): Payer: Self-pay

## 2018-07-21 DIAGNOSIS — E039 Hypothyroidism, unspecified: Secondary | ICD-10-CM

## 2018-07-21 DIAGNOSIS — R7989 Other specified abnormal findings of blood chemistry: Secondary | ICD-10-CM

## 2018-07-21 LAB — T4, FREE: FREE T4: 1.26 ng/dL (ref 0.60–1.60)

## 2018-07-21 LAB — TSH: TSH: 0.01 u[IU]/mL — AB (ref 0.35–4.50)

## 2018-07-22 ENCOUNTER — Ambulatory Visit: Payer: Self-pay | Admitting: Internal Medicine

## 2018-07-23 ENCOUNTER — Ambulatory Visit: Payer: Self-pay | Admitting: Internal Medicine

## 2018-07-29 NOTE — Telephone Encounter (Signed)
Dr. Fidel LevyK---This is just an FYI for you.   Dr Kirtland BouchardK I just got your message. Sorry I was with a client. They set my appointment up at the endo doctor you referred me to for Sept 17. Sure wish they could have seen me sooner but they originally said November. Thanks for all you do.  Ignacia MarvelAaron Jentsch

## 2018-09-02 DIAGNOSIS — E0590 Thyrotoxicosis, unspecified without thyrotoxic crisis or storm: Secondary | ICD-10-CM | POA: Insufficient documentation

## 2018-11-25 ENCOUNTER — Other Ambulatory Visit (HOSPITAL_COMMUNITY): Payer: Self-pay | Admitting: Neurological Surgery

## 2018-11-25 DIAGNOSIS — M546 Pain in thoracic spine: Secondary | ICD-10-CM

## 2018-11-26 ENCOUNTER — Other Ambulatory Visit (HOSPITAL_COMMUNITY): Payer: Self-pay | Admitting: Neurological Surgery

## 2018-11-26 DIAGNOSIS — M545 Low back pain, unspecified: Secondary | ICD-10-CM

## 2018-11-28 ENCOUNTER — Ambulatory Visit (HOSPITAL_COMMUNITY)
Admission: RE | Admit: 2018-11-28 | Discharge: 2018-11-28 | Disposition: A | Discharge status: Home / Self Care | Payer: Self-pay | Source: Ambulatory Visit | Attending: Neurological Surgery | Admitting: Neurological Surgery

## 2018-11-28 DIAGNOSIS — M545 Low back pain, unspecified: Secondary | ICD-10-CM

## 2018-11-28 DIAGNOSIS — M546 Pain in thoracic spine: Secondary | ICD-10-CM | POA: Insufficient documentation

## 2018-12-16 DIAGNOSIS — S42009A Fracture of unspecified part of unspecified clavicle, initial encounter for closed fracture: Secondary | ICD-10-CM

## 2018-12-16 DIAGNOSIS — S2249XA Multiple fractures of ribs, unspecified side, initial encounter for closed fracture: Secondary | ICD-10-CM

## 2018-12-16 HISTORY — DX: Fracture of unspecified part of unspecified clavicle, initial encounter for closed fracture: S42.009A

## 2018-12-16 HISTORY — DX: Multiple fractures of ribs, unspecified side, initial encounter for closed fracture: S22.49XA

## 2019-01-09 ENCOUNTER — Encounter (HOSPITAL_BASED_OUTPATIENT_CLINIC_OR_DEPARTMENT_OTHER): Payer: Self-pay | Admitting: Emergency Medicine

## 2019-01-09 ENCOUNTER — Other Ambulatory Visit: Payer: Self-pay

## 2019-01-09 ENCOUNTER — Emergency Department (HOSPITAL_BASED_OUTPATIENT_CLINIC_OR_DEPARTMENT_OTHER): Payer: Self-pay

## 2019-01-09 ENCOUNTER — Emergency Department (HOSPITAL_BASED_OUTPATIENT_CLINIC_OR_DEPARTMENT_OTHER)
Admission: EM | Admit: 2019-01-09 | Discharge: 2019-01-09 | Disposition: A | Discharge status: Home / Self Care | Payer: Self-pay | Attending: Emergency Medicine | Admitting: Emergency Medicine

## 2019-01-09 DIAGNOSIS — Z79899 Other long term (current) drug therapy: Secondary | ICD-10-CM | POA: Insufficient documentation

## 2019-01-09 DIAGNOSIS — Y998 Other external cause status: Secondary | ICD-10-CM | POA: Insufficient documentation

## 2019-01-09 DIAGNOSIS — M25511 Pain in right shoulder: Secondary | ICD-10-CM

## 2019-01-09 DIAGNOSIS — S2231XA Fracture of one rib, right side, initial encounter for closed fracture: Secondary | ICD-10-CM | POA: Insufficient documentation

## 2019-01-09 DIAGNOSIS — M25561 Pain in right knee: Secondary | ICD-10-CM | POA: Insufficient documentation

## 2019-01-09 DIAGNOSIS — Y9389 Activity, other specified: Secondary | ICD-10-CM | POA: Insufficient documentation

## 2019-01-09 DIAGNOSIS — F329 Major depressive disorder, single episode, unspecified: Secondary | ICD-10-CM | POA: Insufficient documentation

## 2019-01-09 DIAGNOSIS — E039 Hypothyroidism, unspecified: Secondary | ICD-10-CM | POA: Insufficient documentation

## 2019-01-09 DIAGNOSIS — Y929 Unspecified place or not applicable: Secondary | ICD-10-CM | POA: Insufficient documentation

## 2019-01-09 DIAGNOSIS — F419 Anxiety disorder, unspecified: Secondary | ICD-10-CM | POA: Insufficient documentation

## 2019-01-09 DIAGNOSIS — S42031A Displaced fracture of lateral end of right clavicle, initial encounter for closed fracture: Secondary | ICD-10-CM | POA: Insufficient documentation

## 2019-01-09 DIAGNOSIS — S42001A Fracture of unspecified part of right clavicle, initial encounter for closed fracture: Secondary | ICD-10-CM

## 2019-01-09 LAB — CBC WITH DIFFERENTIAL/PLATELET
Abs Immature Granulocytes: 0.03 10*3/uL (ref 0.00–0.07)
Basophils Absolute: 0.1 10*3/uL (ref 0.0–0.1)
Basophils Relative: 0 %
EOS ABS: 0.1 10*3/uL (ref 0.0–0.5)
EOS PCT: 1 %
HEMATOCRIT: 44.6 % (ref 39.0–52.0)
HEMOGLOBIN: 15.5 g/dL (ref 13.0–17.0)
Immature Granulocytes: 0 %
LYMPHS ABS: 1.7 10*3/uL (ref 0.7–4.0)
Lymphocytes Relative: 15 %
MCH: 29.2 pg (ref 26.0–34.0)
MCHC: 34.8 g/dL (ref 30.0–36.0)
MCV: 84.2 fL (ref 80.0–100.0)
MONOS PCT: 12 %
Monocytes Absolute: 1.4 10*3/uL — ABNORMAL HIGH (ref 0.1–1.0)
NEUTROS PCT: 72 %
Neutro Abs: 8 10*3/uL — ABNORMAL HIGH (ref 1.7–7.7)
Platelets: 352 10*3/uL (ref 150–400)
RBC: 5.3 MIL/uL (ref 4.22–5.81)
RDW: 12.4 % (ref 11.5–15.5)
WBC: 11.2 10*3/uL — ABNORMAL HIGH (ref 4.0–10.5)
nRBC: 0 % (ref 0.0–0.2)

## 2019-01-09 LAB — COMPREHENSIVE METABOLIC PANEL
ALBUMIN: 4.4 g/dL (ref 3.5–5.0)
ALK PHOS: 54 U/L (ref 38–126)
ALT: 83 U/L — AB (ref 0–44)
ANION GAP: 10 (ref 5–15)
AST: 59 U/L — ABNORMAL HIGH (ref 15–41)
BUN: 13 mg/dL (ref 6–20)
CALCIUM: 9.4 mg/dL (ref 8.9–10.3)
CO2: 24 mmol/L (ref 22–32)
Chloride: 100 mmol/L (ref 98–111)
Creatinine, Ser: 0.79 mg/dL (ref 0.61–1.24)
GFR calc Af Amer: >60 mL/min (ref >60)
GFR calc non Af Amer: >60 mL/min (ref >60)
Glucose, Bld: 119 mg/dL — ABNORMAL HIGH (ref 70–99)
Potassium: 3.3 mmol/L — ABNORMAL LOW (ref 3.5–5.1)
SODIUM: 134 mmol/L — AB (ref 135–145)
Total Bilirubin: 1.4 mg/dL — ABNORMAL HIGH (ref 0.3–1.2)
Total Protein: 7.7 g/dL (ref 6.5–8.1)

## 2019-01-09 MED ORDER — LIDOCAINE 5 % EX PTCH: 1.0000 | MEDICATED_PATCH | CUTANEOUS | 0 refills | Status: DC

## 2019-01-09 MED ORDER — KETOROLAC TROMETHAMINE 30 MG/ML IJ SOLN
30.0000 mg | Freq: Once | INTRAMUSCULAR | Status: AC
  Administered 2019-01-09: 30 mg via INTRAVENOUS
  Filled 2019-01-09: qty 1

## 2019-01-09 MED ORDER — HYDROCODONE-ACETAMINOPHEN 5-325 MG PO TABS: 1.0000 | ORAL_TABLET | Freq: Four times a day (QID) | ORAL | 0 refills | Status: AC | PRN

## 2019-01-09 MED ORDER — ONDANSETRON 4 MG PO TBDP: 4.0000 mg | ORAL_TABLET | Freq: Three times a day (TID) | ORAL | 0 refills | Status: DC | PRN

## 2019-01-09 MED ORDER — KETOROLAC TROMETHAMINE 30 MG/ML IJ SOLN: 60.0000 mg | Freq: Once | INTRAMUSCULAR | Status: DC

## 2019-01-09 MED ORDER — FENTANYL CITRATE (PF) 100 MCG/2ML IJ SOLN
50.0000 ug | Freq: Once | INTRAMUSCULAR | Status: AC
  Administered 2019-01-09: 50 ug via INTRAVENOUS
  Filled 2019-01-09: qty 2
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
## 2019-01-09 NOTE — ED Notes (Signed)
Patient transported to X-ray 
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
## 2019-01-09 NOTE — ED Notes (Signed)
ED Provider at bedside. 
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
## 2019-11-17 ENCOUNTER — Other Ambulatory Visit: Payer: Self-pay

## 2019-11-17 DIAGNOSIS — Z20822 Contact with and (suspected) exposure to covid-19: Secondary | ICD-10-CM

## 2019-11-19 LAB — NOVEL CORONAVIRUS, NAA: SARS-CoV-2, NAA: NOT DETECTED

## 2021-02-20 ENCOUNTER — Encounter: Payer: Self-pay | Admitting: Family Medicine

## 2021-02-26 ENCOUNTER — Encounter: Payer: Self-pay | Admitting: Family Medicine

## 2021-02-26 ENCOUNTER — Other Ambulatory Visit: Payer: Self-pay

## 2021-02-26 ENCOUNTER — Ambulatory Visit (INDEPENDENT_AMBULATORY_CARE_PROVIDER_SITE_OTHER): Payer: Self-pay | Admitting: Family Medicine

## 2021-02-26 VITALS — BP 96/68 | HR 102 | Temp 98.6°F | Ht 70.25 in | Wt 216.0 lb

## 2021-02-26 DIAGNOSIS — E669 Obesity, unspecified: Secondary | ICD-10-CM | POA: Insufficient documentation

## 2021-02-26 DIAGNOSIS — K635 Polyp of colon: Secondary | ICD-10-CM

## 2021-02-26 DIAGNOSIS — Z7689 Persons encountering health services in other specified circumstances: Secondary | ICD-10-CM

## 2021-02-26 DIAGNOSIS — Z1159 Encounter for screening for other viral diseases: Secondary | ICD-10-CM

## 2021-02-26 DIAGNOSIS — E781 Pure hyperglyceridemia: Secondary | ICD-10-CM

## 2021-02-26 DIAGNOSIS — Z8042 Family history of malignant neoplasm of prostate: Secondary | ICD-10-CM

## 2021-02-26 DIAGNOSIS — Z125 Encounter for screening for malignant neoplasm of prostate: Secondary | ICD-10-CM

## 2021-02-26 DIAGNOSIS — Z131 Encounter for screening for diabetes mellitus: Secondary | ICD-10-CM

## 2021-02-26 DIAGNOSIS — F3130 Bipolar disorder, current episode depressed, mild or moderate severity, unspecified: Secondary | ICD-10-CM | POA: Insufficient documentation

## 2021-02-26 DIAGNOSIS — E039 Hypothyroidism, unspecified: Secondary | ICD-10-CM

## 2021-02-26 DIAGNOSIS — Z0000 Encounter for general adult medical examination without abnormal findings: Secondary | ICD-10-CM

## 2021-02-26 LAB — PSA: PSA: 0.35 ng/mL (ref 0.10–4.00)

## 2021-02-26 LAB — CBC WITH DIFFERENTIAL/PLATELET
Basophils Absolute: 0.1 10*3/uL (ref 0.0–0.1)
Basophils Relative: 1 % (ref 0.0–3.0)
Eosinophils Absolute: 0.4 10*3/uL (ref 0.0–0.7)
Eosinophils Relative: 5.2 % — ABNORMAL HIGH (ref 0.0–5.0)
HCT: 41.2 % (ref 39.0–52.0)
Hemoglobin: 14.7 g/dL (ref 13.0–17.0)
Lymphocytes Relative: 26.8 % (ref 12.0–46.0)
Lymphs Abs: 2 10*3/uL (ref 0.7–4.0)
MCHC: 35.6 g/dL (ref 30.0–36.0)
MCV: 83.9 fl (ref 78.0–100.0)
Monocytes Absolute: 0.7 10*3/uL (ref 0.1–1.0)
Monocytes Relative: 9.5 % (ref 3.0–12.0)
Neutro Abs: 4.2 10*3/uL (ref 1.4–7.7)
Neutrophils Relative %: 57.5 % (ref 43.0–77.0)
Platelets: 298 10*3/uL (ref 150.0–400.0)
RBC: 4.92 Mil/uL (ref 4.22–5.81)
RDW: 13.7 % (ref 11.5–15.5)
WBC: 7.4 10*3/uL (ref 4.0–10.5)

## 2021-02-26 LAB — TSH: TSH: 0.53 u[IU]/mL (ref 0.35–4.50)

## 2021-02-26 LAB — HEMOGLOBIN A1C: Hgb A1c MFr Bld: 5.7 % (ref 4.6–6.5)

## 2021-02-26 LAB — LIPID PANEL
Cholesterol: 224 mg/dL — ABNORMAL HIGH (ref 0–200)
HDL: 32.2 mg/dL — ABNORMAL LOW (ref >39.00)
Total CHOL/HDL Ratio: 7
Triglycerides: 769 mg/dL — ABNORMAL HIGH (ref 0.0–149.0)

## 2021-02-26 LAB — COMPREHENSIVE METABOLIC PANEL
ALT: 81 U/L — ABNORMAL HIGH (ref 0–53)
AST: 39 U/L — ABNORMAL HIGH (ref 0–37)
Albumin: 4.3 g/dL (ref 3.5–5.2)
Alkaline Phosphatase: 73 U/L (ref 39–117)
BUN: 11 mg/dL (ref 6–23)
CO2: 25 mEq/L (ref 19–32)
Calcium: 9.7 mg/dL (ref 8.4–10.5)
Chloride: 104 mEq/L (ref 96–112)
Creatinine, Ser: 0.97 mg/dL (ref 0.40–1.50)
GFR: 93.47 mL/min (ref >60.00)
Glucose, Bld: 106 mg/dL — ABNORMAL HIGH (ref 70–99)
Potassium: 4 mEq/L (ref 3.5–5.1)
Sodium: 138 mEq/L (ref 135–145)
Total Bilirubin: 0.6 mg/dL (ref 0.2–1.2)
Total Protein: 6.8 g/dL (ref 6.0–8.3)

## 2021-02-26 LAB — LDL CHOLESTEROL, DIRECT: Direct LDL: 85 mg/dL

## 2021-02-26 LAB — T4, FREE: Free T4: 0.69 ng/dL (ref 0.60–1.60)

## 2021-02-26 NOTE — Progress Notes (Signed)
This visit occurred during the SARS-CoV-2 public health emergency.  Safety protocols were in place, including screening questions prior to the visit, additional usage of staff PPE, and extensive cleaning of exam room while observing appropriate contact time as indicated for disinfecting solutions.    Patient ID: Kyle Yu, male  DOB: 02-02-1976, 45 y.o.   MRN: [id number] Patient Care Team    Relationship Specialty Notifications Start End  Kyle Leatherwood, DO PCP - General Family Medicine  02/26/21   Kyle Yu., MD  Ophthalmology  02/26/21   Kyle Evener, MD Consulting Physician Psychiatry  02/26/21   Kyle Kidney, MD Consulting Physician Gastroenterology  02/26/21    Comment: digestive Health specialist    Chief Complaint  Patient presents with  . Establish Care    No med refill; requesting CPE; pt is fasting    Subjective:  Kyle Yu is a 45 y.o. male present for CPE. All past medical history, surgical history, allergies, family history, immunizations, medications and social history were updated in the electronic medical record today. All recent labs, ED visits and hospitalizations within the last year were reviewed.  Health maintenance:  Colonoscopy: last screen 2020- [facility]. Colon polyps.  Immunizations:  tdap UTD 2018, influenza declined, covid declined Infectious disease screening: HIV declined, and Hep C declined PSA: No results found for: PSA, pt was counseled on prostate cancer screenings.PSA collected today.  FHX of prostate cancer x2.  Assistive device: none Oxygen [id number] Patient has a Dental home. Hospitalizations/ED visits: reviewed  Depression screen [facility] 2/9 02/26/2021  Decreased Interest 2  Down, Depressed, Hopeless 2  PHQ - 2 Score 4  Altered sleeping 1  Tired, decreased energy 3  Change in appetite 0  Feeling bad or failure about yourself  2  Trouble concentrating 2  Moving slowly or fidgety/restless 0   Suicidal thoughts 0  PHQ-9 Score 12   GAD 7 : Generalized Anxiety Score 02/26/2021  Nervous, Anxious, on Edge 2  Control/stop worrying 3  Worry too much - different things 2  Trouble relaxing 2  Restless 2  Easily annoyed or irritable 3  Afraid - awful might happen 2  Total GAD 7 Score 16       No flowsheet data found.  Immunization History  Administered Date(s) Administered  . Hep A / Hep B 08/02/2004  . Hepatitis B 09/05/2004  . Tdap 02/06/2017  . Typhoid Live 09/05/2004   Past Medical History:  Diagnosis Date  . Chronic tonsillitis 08/30/2010   Qualifier: Diagnosis of  By: Amador Cunas  MD, Janett Labella   . Clavicle fracture 2020   Mid right clavicle fracture  . Colon polyps   . Complication of anesthesia    "mildly aggressive" coming out of anes.  . Dental crown present   . GERD (gastroesophageal reflux disease)   . History of Yu stones   . Myalgia and myositis 01/25/2014  . Rib fractures 2020   right 9th rib fractures  . Subluxation of carpometacarpal joint of left thumb 10/2013   Allergies  Allergen Reactions  . Penicillins Anaphylaxis  . Contrast Media [Iodinated Diagnostic Agents] Other (See Comments)    Skin peels ("maybe 18 years ago it did it a little bit after a myelogram, then about 10 years ago it happened more;" happens two days later).  Pre-medicated today with Solumedrol 125 mg IM and Benadryl 50mg  PO.  Given Rx for dosepack, as well, as reaction seems to occur days after contrast.  Kyle Sievert, RN (& Dr. Davonna Belling 04/11/17)   . Morphine And Related Itching  . Percocet [Oxycodone-Acetaminophen] Itching    IF HE TAKES BENADRYL WITH IT, HE CAN TOLERATE IT   Past Surgical History:  Procedure Laterality Date  . COLONOSCOPY  2020   2017- polyps5/2017. flex sig 10/2016  . HEMORROIDECTOMY  2017   x3  . LIGAMENT REPAIR Left 11/02/2013   Procedure: LEFT THUMB CARPALMETACARPAL LIGAMENT REPAIR,PINNING CARPALMETACARPAL JOINT;  Surgeon: Kyle Ribas, MD;   Location: [facility];  Service: Orthopedics;  Laterality: Left;  . LUMBAR LAMINECTOMY  2001; 2011 X 2   fusion  . TONSILLECTOMY    . VASECTOMY     Family History  Problem Relation Age of Onset  . Prostate cancer Father 53  . Diabetes Maternal Grandmother   . Prostate cancer Paternal Grandfather 81  . Colon cancer Neg Hx   . Colon polyps Neg Hx   . Stroke Neg Hx    Social History   Social History Narrative   Marital status/children/pets: Married   Education/employment: college educated, Medical laboratory scientific officer:      -smoke alarm in the home:Yes     - wears seatbelt: Yes     - Feels safe in their relationships: Yes    Allergies as of 02/26/2021      Reactions   Penicillins Anaphylaxis   Contrast Media [iodinated Diagnostic Agents] Other (See Comments)   Skin peels ("maybe 18 years ago it did it a little bit after a myelogram, then about 10 years ago it happened more;" happens two days later).  Pre-medicated today with Solumedrol 125 mg IM and Benadryl 50mg  PO.  Given Rx for dosepack, as well, as reaction seems to occur days after contrast.  , RN (& Dr. [name] 04/11/17)   Morphine And Related Itching   Percocet [oxycodone-acetaminophen] Itching   IF HE TAKES BENADRYL WITH IT, HE CAN TOLERATE IT      Medication List       Accurate as of February 26, 2021  9:55 AM. If you have any questions, ask your nurse or doctor.        STOP taking these medications   lidocaine 5 % Commonly known as: Lidoderm Stopped by: February 28, 2021, DO   ondansetron 4 MG disintegrating tablet Commonly known as: Zofran ODT Stopped by: Kyle Pacini, DO   pregabalin 75 MG capsule Commonly known as: LYRICA Stopped by: Kyle Pacini, DO   venlafaxine XR 150 MG 24 hr capsule Commonly known as: EFFEXOR-XR Stopped by: Kyle Pacini, DO     TAKE these medications   esomeprazole 40 MG capsule Commonly known as: NEXIUM 40 mg daily.   QUEtiapine Fumarate 150 MG 24 hr  tablet Commonly known as: SEROQUEL XR Take 450 mg by mouth at bedtime.      All past medical history, surgical history, allergies, family history, immunizations andmedications were updated in the EMR today and reviewed under the history and medication portions of their EMR.     No results found for this or any previous visit (from the past 2160 hour(s)).   ROS: 14 pt review of systems performed and negative (unless mentioned in an HPI)  Objective: BP 96/68   Pulse (!) 102   Temp 98.6 F (37 C) (Oral)   Ht 5' 10.25" (1.784 m)   Wt 216 lb (98 kg)   SpO2 98%   BMI 30.77 kg/m  Gen: Afebrile. No acute distress. Nontoxic in  appearance, well-developed, well-nourished,  Very pleasant male. Mildly obese.  HENT: AT. [place]. Bilateral TM visualized and normal in appearance, normal external auditory canal. MMM, no oral lesions, adequate dentition. Bilateral nares within normal limits. Throat without erythema, ulcerations or exudates. no Cough on exam, no hoarseness on exam. Eyes:Pupils Equal Round Reactive to light, Extraocular movements intact,  Conjunctiva without redness, discharge or icterus. Neck/lymp/endocrine: Supple,no lymphadenopathy, no thyromegaly CV: RRR no murmur, no edema, +2/4 P posterior tibialis pulses.  Chest: CTAB, no wheeze, rhonchi or crackles. normal Respiratory effort. good Air movement. Abd: Soft. flat. NTND. BS present. no Masses palpated. No hepatosplenomegaly. No rebound tenderness or guarding. Skin: no rashes, purpura or petechiae. Warm and well-perfused. Skin intact. Neuro/Msk:  Normal gait. PERLA. EOMi. Alert. Oriented x3.  Cranial nerves II through XII intact. Muscle strength 5/5 upper/lower extremity. DTRs equal bilaterally. Psych: Normal affect, dress and demeanor. Normal speech. Normal thought content and judgment.  No exam data present  Assessment/plan: YEHOSHUA VITELLI is a 45 y.o. male present for CPE/TOC Establishing care with new doctor, encounter  for Acquired hypothyroidism - currently not on medications - TSH - T4, free Hypertriglyceridemia/Obesity (BMI 30-39.9) - currently not on medications - dietary modifications and routine exercise.  - CBC with Differential/Platelet - Comprehensive metabolic panel - Lipid panel  Polyp of colon, unspecified part of colon, unspecified type Pt reports 5-7 yr follow up - records requested  Diabetes mellitus screening - Comprehensive metabolic panel - Hemoglobin A1c Need for hepatitis C screening test - Hepatitis C antibody Prostate cancer screening/Family history of prostate cancer in father - PSA Bipolar affective disorder, current episode depressed, current episode severity unspecified (HCC) Managed by psychiatry. Currently prescribed seroquel.   Routine general medical examination at a health care facility Patient was encouraged to exercise greater than 150 minutes a week. Patient was encouraged to choose a diet filled with fresh fruits and vegetables, and lean meats. AVS provided to patient today for education/recommendation on gender specific health and safety maintenance. Colonoscopy: last screen 2020- [facility] health specialists. Colon polyps.  Immunizations:  tdap UTD 2018, influenza declined, covid declined Infectious disease screening: HIV declined, and Hep C declined PSA: No results found for: PSA, pt was counseled on prostate cancer screenings.PSA collected today.  FHX of prostate cancer x2.   Return in about 1 year (around 02/26/2022) for CPE (30 min).  Orders Placed This Encounter  Procedures  . CBC with Differential/Platelet  . Comprehensive metabolic panel  . Hemoglobin A1c  . Lipid panel  . TSH  . T4, free  . Hepatitis C antibody  . PSA   No orders of the defined types were placed in this encounter.  Referral Orders  No referral(s) requested today     Note is dictated utilizing voice recognition software. Although note has been proof read prior to  signing, occasional typographical errors still can be missed. If any questions arise, please do not hesitate to call for verification.  Electronically signed by: Kyle Pacini, DO  [facility]- [place]

## 2021-02-26 NOTE — Patient Instructions (Signed)

## 2021-02-27 ENCOUNTER — Telehealth: Payer: Self-pay | Admitting: Family Medicine

## 2021-02-27 LAB — HEPATITIS C ANTIBODY
Hepatitis C Ab: NONREACTIVE
SIGNAL TO CUT-OFF: 0.01 (ref <1.00)

## 2021-02-27 MED ORDER — FENOFIBRATE 145 MG PO TABS: 145.0000 mg | ORAL_TABLET | Freq: Every day | ORAL | 3 refills | Status: DC

## 2021-02-27 MED ORDER — OMEGA-3-ACID ETHYL ESTERS 1 G PO CAPS: 2.0000 g | ORAL_CAPSULE | Freq: Two times a day (BID) | ORAL | 3 refills | Status: DC

## 2021-02-27 NOTE — Telephone Encounter (Signed)
Please call patient kidney and thyroid function are normal Blood cell counts and electrolytes are normal Prostate cancer screening is normal Liver enzymes are elevated.  It appears they have been elevated for quite a few years and are stable from prior collections. Diabetes screening/A1c is is just mildly in the "prediabetic" range with an A1c of 5.7 and a fasting glucose of 106.  No prescribed medications needed at this time.     -Recommendations are to avoid moderate-high sugar content consumption, and meals high in carbohydrate load.  Increase exercise to at least 150 minutes a week will help avoid progression to diabetes.   -Recommend also starting a twice daily fiber supplement, such as Metamucil which will help both with diabetes and his cholesterol.  Cholesterol panel is very high.  He is at a higher cardiovascular risk for a heart attack or stroke in the next 10 years, with his current levels.  Medication start is indicated.  In reading his past medical records and found that he had tried statin at one time and had muscle aches with use.  The medications I have called in are not statin type. -Start Lovaza/omega-3 at 2 tabs twice a day -Start fenofibrate 1 tab daily.  Follow-up in 3 months for fasting labs with provider to recheck levels and tolerance of medication.  Lastly, we have received his records from his gastroenterologist, and it appears that a wanted him back in 2020 for repeat.  His last colonoscopy was in 2017.  I would encourage him to call them to be placed on the schedule.

## 2021-02-27 NOTE — Telephone Encounter (Signed)
Spoke with pt regarding labs and instructions.   

## 2021-06-04 ENCOUNTER — Ambulatory Visit: Payer: Self-pay | Admitting: Family Medicine

## 2022-02-28 ENCOUNTER — Encounter: Payer: Self-pay | Admitting: Family Medicine

## 2022-09-10 ENCOUNTER — Ambulatory Visit (INDEPENDENT_AMBULATORY_CARE_PROVIDER_SITE_OTHER): Payer: Self-pay | Admitting: Family Medicine

## 2022-09-10 VITALS — BP 123/73 | HR 64 | Temp 98.6°F | Ht 70.25 in | Wt 208.0 lb

## 2022-09-10 DIAGNOSIS — K1120 Sialoadenitis, unspecified: Secondary | ICD-10-CM

## 2022-09-10 DIAGNOSIS — R220 Localized swelling, mass and lump, head: Secondary | ICD-10-CM

## 2022-09-10 MED ORDER — METHYLPREDNISOLONE ACETATE 80 MG/ML IJ SUSP
80.0000 mg | Freq: Once | INTRAMUSCULAR | Status: AC
  Administered 2022-09-10: 80 mg via INTRAMUSCULAR

## 2022-09-10 MED ORDER — CHLORHEXIDINE GLUCONATE 0.12 % MT SOLN: 15.0000 mL | Freq: Two times a day (BID) | OROMUCOSAL | 1 refills | Status: AC

## 2022-09-10 MED ORDER — LEVOFLOXACIN 500 MG PO TABS: 500.0000 mg | ORAL_TABLET | Freq: Every day | ORAL | 0 refills | Status: AC

## 2022-09-10 MED ORDER — NAPROXEN 500 MG PO TABS: 500.0000 mg | ORAL_TABLET | Freq: Two times a day (BID) | ORAL | 0 refills | Status: AC

## 2022-09-10 MED ORDER — METRONIDAZOLE 500 MG PO TABS: 500.0000 mg | ORAL_TABLET | Freq: Three times a day (TID) | ORAL | 0 refills | Status: AC

## 2022-09-10 NOTE — Patient Instructions (Signed)
Parotitis  Parotitis is inflammation of one or both of the parotid glands. These glands produce saliva. They are found on each side of the face, below and in front of the earlobes. The saliva that they produce comes out of tiny openings (ducts) inside the cheeks. Parotitis may cause sudden swelling and pain (acute parotitis). It can also cause repeated episodes of swelling and pain or continued swelling that may or may not be painful (chronic parotitis). What are the causes? This condition may be caused by: Infections from bacteria. Infections from viruses, such as mumps or HIV. Blockage (obstruction) of saliva flow through the parotid glands. This can be from a stone, scar tissue, or a tumor. Diseases that cause your body's defense system (immune system) to attack healthy cells in your salivary glands. These are called autoimmune diseases. What increases the risk? You are more likely to develop this condition if: You are 50 years old or older. You do not drink enough fluids (are dehydrated). You drink too much alcohol. You have: A dry mouth. Diabetes. Gout. A long-term illness. You do not take good care of your mouth and teeth (poor oral hygiene). You have had radiation treatments to the head and neck. You take certain medicines. What are the signs or symptoms? Symptoms of this condition depend on the cause. Symptoms may include: Swelling under and in front of the ear. This may get worse after eating. Pain and tenderness over the parotid gland. This may get worse after eating. Redness and warmth of the skin over the parotid gland. Fever or chills. Pus coming from the ducts inside the mouth. Dry mouth. A bad taste in the mouth. How is this diagnosed? This condition may be diagnosed based on: Your medical history. A physical exam. Tests to find the cause of the parotitis. These may include: Doing blood tests to check for an autoimmune disease or infections from a virus. Taking a  fluid sample from the parotid gland and testing it for infection. Injecting the ducts of the parotid gland with a dye and then taking X-rays (sialogram). Having other imaging tests of the gland, such as X-rays, ultrasound, MRI, or CT scan. Checking the opening of the gland for a stone or obstruction. Placing a needle into the gland to remove tissue for a biopsy (fine needle aspiration). How is this treated? Treatment for this condition depends on the cause. Treatment may include: Antibiotic medicine for a bacterial infection. NSAIDs, such as ibuprofen, to treat pain and swelling. Drinking more fluids. Removing a stone or obstruction. Treating an underlying disease that is causing parotitis. Surgery to drain an infection, remove a growth, or remove the whole gland (parotidectomy). Treatment may not be needed if parotid swelling goes away with home care. Follow these instructions at home: Medicines  Take over-the-counter and prescription medicines only as told by your health care provider. If you were prescribed an antibiotic medicine, take it as told by your health care provider. Do not stop taking the antibiotic even if you start to feel better. Managing pain and swelling If directed, apply heat to the affected area as often as told by your health care provider. Use the heat source that your health care provider recommends, such as a moist heat pack or a heating pad. Place a towel between your skin and the heat source. Leave the heat on for 20-30 minutes. Remove the heat if your skin turns bright red. This is especially important if you are unable to feel pain, heat, or cold.   You have a greater risk of getting burned. Gargle with a mixture of salt and water 3-4 times a day or as needed. To make salt water, completely dissolve -1 tsp (3-6 g) of salt in 1 cup (237 mL) of warm water. Gently massage the parotid glands as told by your health care provider. General instructions  Drink enough  fluid to keep your urine pale yellow. Keep your mouth clean and moist. Try sucking on sour candy. This may help to make your mouth less dry by stimulating the flow of saliva. Maintain good oral health. Brush your teeth at least two times a day. Floss your teeth every day. See your dentist regularly. Do not use any products that contain nicotine or tobacco. These products include cigarettes, chewing tobacco, and vaping devices, such as e-cigarettes. If you need help quitting, ask your health care provider. Do not drink alcohol. Keep all follow-up visits. This is important. Contact a health care provider if: You have a fever or chills. You have new symptoms. Your symptoms get worse. Your symptoms do not improve with treatment. Get help right away if: You have difficulty breathing or swallowing because of the swollen gland. These symptoms may represent a serious problem that is an emergency. Do not wait to see if the symptoms will go away. Get medical help right away. Call your local emergency services (911 in the [place] Do not drive yourself to the hospital. Summary Parotitis is inflammation of one or both of the parotid glands. Symptoms include pain and swelling under and in front of the ear. They may also include a fever and a bad taste in your mouth. This condition may be treated with antibiotics, NSAIDs, increasing fluids, or surgery. In some cases, parotitis may go away on its own without treatment. You should drink plenty of fluids, maintain good oral health, and do not use products that contain nicotine or tobacco. This information is not intended to replace advice given to you by your health care provider. Make sure you discuss any questions you have with your health care provider. Document Revised: 04/13/2021 Document Reviewed: 04/13/2021 Else[employer] Patient Education  2023 [employer]  

## 2022-09-10 NOTE — Progress Notes (Signed)
Kyle Yu , Jan 21, 1976, 46 y.o., male MRN: [id number] Patient Care Team    Relationship Specialty Notifications Start End  Natalia Leatherwood, DO PCP - General Family Medicine  02/26/21   Marzella Schlein., MD  Ophthalmology  02/26/21   Milagros Evener, MD Consulting Physician Psychiatry  02/26/21   Stanton Kidney, MD Consulting Physician Gastroenterology  02/26/21    Comment: digestive Health specialist    Chief Complaint  Patient presents with   Facial Swelling    Pt present of L jaw swelling and pain x 2 days; denies injury or tooth pain     Subjective: Pt presents for an OV with complaints of jaw swelling and pain of 2 days duration.  He denies any fever or chills.  He denies any tooth pain.  He states prior to onset of symptoms he felt like his gumline was burning and he used a new mouthwash to help with the burning sensation.  He then noticed that he had rather significant swelling the next morning.  He then felt pain deep behind his ear that was shooting down the left side of his neck.  He used a heating pad overnight and took some ibuprofen and he states the swelling went down a small margin but is still significantly swollen.  He has a dentist appointment set up in 2 weeks..      02/26/2021    9:36 AM  Depression screen PHQ 2/9  Decreased Interest 2  Down, Depressed, Hopeless 2  PHQ - 2 Score 4  Altered sleeping 1  Tired, decreased energy 3  Change in appetite 0  Feeling bad or failure about yourself  2  Trouble concentrating 2  Moving slowly or fidgety/restless 0  Suicidal thoughts 0  PHQ-9 Score 12    Allergies  Allergen Reactions   Penicillins Anaphylaxis   Contrast Media [Iodinated Contrast Media] Other (See Comments)    Skin peels ("maybe 18 years ago it did it a little bit after a myelogram, then about 10 years ago it happened more;" happens two days later).  Pre-medicated today with Solumedrol 125 mg IM and Benadryl 50mg  PO.  Given Rx for dosepack, as  well, as reaction seems to occur days after contrast.  , RN (& Dr. Donell Sievert 04/11/17)    Morphine And Related Itching   Percocet [Oxycodone-Acetaminophen] Itching    IF HE TAKES BENADRYL WITH IT, HE CAN TOLERATE IT   Statins Other (See Comments)    Myalgias-unknown statin   Social History   Social History Narrative   Marital status/children/pets: Married   Education/employment: college educated, 04/13/17:      -smoke alarm in the home:Yes     - wears seatbelt: Yes     - Feels safe in their relationships: Yes   Past Medical History:  Diagnosis Date   Chronic tonsillitis 08/30/2010   Qualifier: Diagnosis of  By: 09/01/2010  MD, Amador Cunas    Clavicle fracture 2020   Mid right clavicle fracture   Colon polyps    Complication of anesthesia    "mildly aggressive" coming out of [facility]es.   Dental crown present    GERD (gastroesophageal reflux disease)    History of kidney stones    Myalgia and myositis 01/25/2014   Rib fractures 2020   right 9th rib fractures   Subluxation of carpometacarpal joint of left thumb 10/2013   Past Surgical History:  Procedure Laterality Date  COLONOSCOPY  2020   2017- polyps5/2017. flex sig 10/2016   HEMORROIDECTOMY  2017   x3   LIGAMENT REPAIR Left 11/02/2013   Procedure: LEFT THUMB CARPALMETACARPAL LIGAMENT REPAIR,PINNING CARPALMETACARPAL JOINT;  Surgeon: Tennis Must, MD;  Location: [facility];  Service: Orthopedics;  Laterality: Left;   LUMBAR FUSION  2001; 2011 X 2   L3-5   TONSILLECTOMY     VASECTOMY     Family History  Problem Relation Age of Onset   Prostate cancer Father 5   Diabetes Maternal Grandmother    Prostate cancer Paternal Grandfather 94   Colon cancer Neg Hx    Colon polyps Neg Hx    Stroke Neg Hx    Allergies as of 09/10/2022       Reactions   Penicillins Anaphylaxis   Contrast Media [iodinated Contrast Media] Other (See Comments)   Skin peels ("maybe 18 years ago it did it a  little bit after a myelogram, then about 10 years ago it happened more;" happens two days later).  Pre-medicated today with Solumedrol 125 mg IM and Benadryl 50mg  PO.  Given Rx for dosepack, as well, as reaction seems to occur days after contrast.  Brita Romp, RN (& Dr. Rolla Flatten 04/11/17)   Morphine And Related Itching   Percocet [oxycodone-acetaminophen] Itching   IF HE TAKES BENADRYL WITH IT, HE CAN TOLERATE IT   Statins Other (See Comments)   Myalgias-unknown statin        Medication List        Accurate as of September 10, 2022  6:07 PM. If you have any questions, ask your nurse or doctor.          STOP taking these medications    fenofibrate 145 MG tablet Commonly known as: TRICOR Stopped by: Howard Pouch, DO   omega-3 acid ethyl esters 1 g capsule Commonly known as: LOVAZA Stopped by: Howard Pouch, DO       TAKE these medications    chlorhexidine 0.12 % solution Commonly known as: PERIDEX Use as directed 15 mLs in the mouth or throat 2 (two) times daily. Started by: Howard Pouch, DO   esomeprazole 40 MG capsule Commonly known as: NEXIUM 10 mg.   levofloxacin 500 MG tablet Commonly known as: LEVAQUIN Take 1 tablet (500 mg total) by mouth daily for 10 days. Started by: Howard Pouch, DO   metroNIDAZOLE 500 MG tablet Commonly known as: FLAGYL Take 1 tablet (500 mg total) by mouth 3 (three) times daily for 10 days. Started by: Howard Pouch, DO   naproxen 500 MG tablet Commonly known as: Naprosyn Take 1 tablet (500 mg total) by mouth 2 (two) times daily with a meal. Started by: Howard Pouch, DO   QUEtiapine Fumarate 150 MG 24 hr tablet Commonly known as: SEROQUEL XR Take 450 mg by mouth at bedtime.        All past medical history, surgical history, allergies, family history, immunizations andmedications were updated in the EMR today and reviewed under the history and medication portions of their EMR.     Review of Systems  Constitutional:   Negative for chills, fever and malaise/fatigue.  HENT:  Positive for ear pain. Negative for congestion, ear discharge, sinus pain, sore throat and tinnitus.   Respiratory:  Negative for cough, shortness of breath and wheezing.   Gastrointestinal:  Negative for nausea.  Skin:  Negative for rash.  Neurological:  Negative for dizziness and headaches.   Negative, with the exception of  above mentioned in HPI   Objective:  BP 123/73   Pulse 64   Temp 98.6 F (37 C) (Oral)   Ht 5' 10.25" (1.784 m)   Wt 208 lb (94.3 kg)   SpO2 98%   BMI 29.63 kg/m  Body mass index is 29.63 kg/m. Physical Exam Vitals and nursing note reviewed.  Constitutional:      General: He is not in acute distress.    Appearance: Normal appearance. He is not ill-appearing, toxic-appearing or diaphoretic.  HENT:     Head: Normocephalic and atraumatic.     Salivary Glands: Left salivary gland is diffusely enlarged and tender.      Right Ear: Tympanic membrane normal.     Left Ear: Tympanic membrane normal.     Nose: No congestion or rhinorrhea.     Mouth/Throat:     Lips: No lesions.     Mouth: Mucous membranes are moist. Oral lesions present.     Dentition: Abnormal dentition. No dental tenderness.     Tongue: No lesions.     Palate: Lesions present.     Pharynx: Posterior oropharyngeal erythema present. No oropharyngeal exudate or uvula swelling.     Tonsils: No tonsillar exudate or tonsillar abscesses.   Eyes:     General: No scleral icterus.       Right eye: No discharge.        Left eye: No discharge.     Extraocular Movements: Extraocular movements intact.     Pupils: Pupils are equal, round, and reactive to light.  Skin:    General: Skin is warm and dry.     Coloration: Skin is not jaundiced or pale.     Findings: No rash.  Neurological:     Mental Status: He is alert and oriented to person, place, and time. Mental status is at baseline.  Psychiatric:        Mood and Affect: Mood normal.         Behavior: Behavior normal.        Thought Content: Thought content normal.        Judgment: Judgment normal.      No results found. No results found. No results found for this or any previous visit (from the past 24 hour(s)).  Assessment/Plan: PROSPERO MAHNKE is a 46 y.o. male present for OV for  Facial swelling/Parotitis Patient has significant swelling over left parotid gland with punctate lesion in left cheek ? Blocked duct -Discussed at home methods to help with unblocking duct and allowing increased saliva flow. - methylPREDNISolone acetate (DEPO-MEDROL) injection 80 mg -Naproxen 500 twice daily with food x7 days prescribed -Patient is penicillin allergic.  Levaquin 500 daily x10 days with Flagyl 3 times daily x10 days prescribed Peridex solution twice daily Follow-up with provider in 1 week and dentist in 2 weeks.  Sooner if worsening.  Reviewed expectations re: course of current medical issues. Discussed self-management of symptoms. Outlined signs and symptoms indicating need for more acute intervention. Patient verbalized understanding and all questions were answered. Patient received an After-Visit Summary.    No orders of the defined types were placed in this encounter.  Meds ordered this encounter  Medications   chlorhexidine (PERIDEX) 0.12 % solution    Sig: Use as directed 15 mLs in the mouth or throat 2 (two) times daily.    Dispense:  120 mL    Refill:  1   levofloxacin (LEVAQUIN) 500 MG tablet    Sig: Take 1 tablet (  500 mg total) by mouth daily for 10 days.    Dispense:  10 tablet    Refill:  0   metroNIDAZOLE (FLAGYL) 500 MG tablet    Sig: Take 1 tablet (500 mg total) by mouth 3 (three) times daily for 10 days.    Dispense:  30 tablet    Refill:  0   naproxen (NAPROSYN) 500 MG tablet    Sig: Take 1 tablet (500 mg total) by mouth 2 (two) times daily with a meal.    Dispense:  14 tablet    Refill:  0   methylPREDNISolone acetate (DEPO-MEDROL)  injection 80 mg   Referral Orders  No referral(s) requested today     Note is dictated utilizing voice recognition software. Although note has been proof read prior to signing, occasional typographical errors still can be missed. If any questions arise, please do not hesitate to call for verification.   electronically signed by:  Felix Pacini, DO  [facility] [place]

## 2022-09-13 ENCOUNTER — Telehealth: Payer: Self-pay | Admitting: Family Medicine

## 2022-09-13 NOTE — Telephone Encounter (Signed)
Pt was seen recently for jaw swelling. He believes he has a stone in his duck per patient and is asking to have a referral to an ENT dr. To have it removed. The patient ask that someone give him a call regarding this at.  [phone]

## 2022-09-13 NOTE — Telephone Encounter (Signed)
Please advise if referral is appropriate

## 2022-09-13 NOTE — Telephone Encounter (Signed)
R/s appt for pt

## 2022-09-13 NOTE — Telephone Encounter (Signed)
Please have patient schedule his follow up appt early next week as we discussed in his visit.  We re-evaluate at that time.  ENT will not get him that quickly and not every salivary/parotid stone needs ENT to intervene.

## 2022-09-16 ENCOUNTER — Ambulatory Visit (INDEPENDENT_AMBULATORY_CARE_PROVIDER_SITE_OTHER): Payer: Self-pay | Admitting: Family Medicine

## 2022-09-16 ENCOUNTER — Encounter: Payer: Self-pay | Admitting: Family Medicine

## 2022-09-16 ENCOUNTER — Ambulatory Visit: Payer: Self-pay | Admitting: Family Medicine

## 2022-09-16 VITALS — BP 116/78 | HR 77 | Temp 98.0°F | Wt 208.6 lb

## 2022-09-16 DIAGNOSIS — K1120 Sialoadenitis, unspecified: Secondary | ICD-10-CM

## 2022-09-16 DIAGNOSIS — R220 Localized swelling, mass and lump, head: Secondary | ICD-10-CM

## 2022-09-16 NOTE — Progress Notes (Signed)
Kyle Yu , Mar 11, 1976, 46 y.o., male MRN: [id number] Patient Care Team    Relationship Specialty Notifications Start End  Natalia Leatherwood, DO PCP - General Family Medicine  02/26/21   Marzella Schlein., MD  Ophthalmology  02/26/21   Milagros Evener, MD Consulting Physician Psychiatry  02/26/21   Stanton Kidney, MD Consulting Physician Gastroenterology  02/26/21    Comment: digestive Health specialist    Chief Complaint  Patient presents with   Facial Swelling    No c/o pain, swelling has gone away. States he felt a stone in his facial gland     Subjective: Kyle Yu is a 46 y.o. male present for follow up on parotitis. He reports the swelling has completely resolved. He is finishing his abx. Pain is resolved. No fever or chills.   Prior note:  Pt presents for an OV with complaints of jaw swelling and pain of 2 days duration.  He denies any fever or chills.  He denies any tooth pain.  He states prior to onset of symptoms he felt like his gumline was burning and he used a new mouthwash to help with the burning sensation.  He then noticed that he had rather significant swelling the next morning.  He then felt pain deep behind his ear that was shooting down the left side of his neck.  He used a heating pad overnight and took some ibuprofen and he states the swelling went down a small margin but is still significantly swollen.  He has a dentist appointment set up in 2 weeks..      02/26/2021    9:36 AM  Depression screen PHQ 2/9  Decreased Interest 2  Down, Depressed, Hopeless 2  PHQ - 2 Score 4  Altered sleeping 1  Tired, decreased energy 3  Change in appetite 0  Feeling bad or failure about yourself  2  Trouble concentrating 2  Moving slowly or fidgety/restless 0  Suicidal thoughts 0  PHQ-9 Score 12    Allergies  Allergen Reactions   Penicillins Anaphylaxis   Contrast Media [Iodinated Contrast Media] Other (See Comments)    Skin peels ("maybe 18 years ago it  did it a little bit after a myelogram, then about 10 years ago it happened more;" happens two days later).  Pre-medicated today with Solumedrol 125 mg IM and Benadryl 50mg  PO.  Given Rx for dosepack, as well, as reaction seems to occur days after contrast.  , RN (& Dr. Donell Sievert 04/11/17)    Morphine And Related Itching   Percocet [Oxycodone-Acetaminophen] Itching    IF HE TAKES BENADRYL WITH IT, HE CAN TOLERATE IT   Statins Other (See Comments)    Myalgias-unknown statin   Social History   Social History Narrative   Marital status/children/pets: Married   Education/employment: college educated, 04/13/17:      -smoke alarm in the home:Yes     - wears seatbelt: Yes     - Feels safe in their relationships: Yes   Past Medical History:  Diagnosis Date   Chronic tonsillitis 08/30/2010   Qualifier: Diagnosis of  By: 09/01/2010  MD, Amador Cunas    Clavicle fracture 2020   Mid right clavicle fracture   Colon polyps    Complication of anesthesia    "mildly aggressive" coming out of [facility].   Dental crown present    GERD (gastroesophageal reflux disease)    History of kidney stones  Myalgia and myositis 01/25/2014   Rib fractures 2020   right 9th rib fractures   Subluxation of carpometacarpal joint of left thumb 10/2013   Past Surgical History:  Procedure Laterality Date   COLONOSCOPY  2020   2017- polyps5/2017. flex sig 10/2016   HEMORROIDECTOMY  2017   x3   LIGAMENT REPAIR Left 11/02/2013   Procedure: LEFT THUMB CARPALMETACARPAL LIGAMENT REPAIR,PINNING CARPALMETACARPAL JOINT;  Surgeon: Tami Ribas, MD;  Location: [facility];  Service: Orthopedics;  Laterality: Left;   LUMBAR FUSION  2001; 2011 X 2   L3-5   TONSILLECTOMY     VASECTOMY     Family History  Problem Relation Age of Onset   Prostate cancer Father 70   Diabetes Maternal Grandmother    Prostate cancer Paternal Grandfather 5   Colon cancer Neg Hx    Colon polyps Neg Hx     Stroke Neg Hx    Allergies as of 09/16/2022       Reactions   Penicillins Anaphylaxis   Contrast Media [iodinated Contrast Media] Other (See Comments)   Skin peels ("maybe 18 years ago it did it a little bit after a myelogram, then about 10 years ago it happened more;" happens two days later).  Pre-medicated today with Solumedrol 125 mg IM and Benadryl 50mg  PO.  Given Rx for dosepack, as well, as reaction seems to occur days after contrast.  , RN (& Dr. Donell Sievert 04/11/17)   Morphine And Related Itching   Percocet [oxycodone-acetaminophen] Itching   IF HE TAKES BENADRYL WITH IT, HE CAN TOLERATE IT   Statins Other (See Comments)   Myalgias-unknown statin        Medication List        Accurate as of September 16, 2022  2:47 PM. If you have any questions, ask your nurse or doctor.          STOP taking these medications    QUEtiapine Fumarate 150 MG 24 hr tablet Commonly known as: SEROQUEL XR Stopped by: September 18, 2022, DO       TAKE these medications    chlorhexidine 0.12 % solution Commonly known as: PERIDEX Use as directed 15 mLs in the mouth or throat 2 (two) times daily.   esomeprazole 40 MG capsule Commonly known as: NEXIUM 10 mg.   levofloxacin 500 MG tablet Commonly known as: LEVAQUIN Take 1 tablet (500 mg total) by mouth daily for 10 days.   metroNIDAZOLE 500 MG tablet Commonly known as: FLAGYL Take 1 tablet (500 mg total) by mouth 3 (three) times daily for 10 days.   naproxen 500 MG tablet Commonly known as: Naprosyn Take 1 tablet (500 mg total) by mouth 2 (two) times daily with a meal.        All past medical history, surgical history, allergies, family history, immunizations andmedications were updated in the EMR today and reviewed under the history and medication portions of their EMR.     Review of Systems  Constitutional:  Negative for chills, fever and malaise/fatigue.  HENT:  Positive for ear pain. Negative for congestion, ear  discharge, sinus pain, sore throat and tinnitus.   Respiratory:  Negative for cough, shortness of breath and wheezing.   Gastrointestinal:  Negative for nausea.  Skin:  Negative for rash.  Neurological:  Negative for dizziness and headaches.   Negative, with the exception of above mentioned in HPI   Objective:  BP 116/78   Pulse 77   Temp 98 F (  36.7 C)   Wt 208 lb 9.6 oz (94.6 kg)   SpO2 96%   BMI 29.72 kg/m  Body mass index is 29.72 kg/m. Physical Exam Vitals and nursing note reviewed.  Constitutional:      General: He is not in acute distress.    Appearance: Normal appearance. He is not ill-appearing, toxic-appearing or diaphoretic.  HENT:     Head: Normocephalic and atraumatic.     Comments: No facial swelling.     Mouth/Throat:     Mouth: Mucous membranes are moist.     Pharynx: No oropharyngeal exudate or posterior oropharyngeal erythema.  Eyes:     General: No scleral icterus.       Right eye: No discharge.        Left eye: No discharge.     Extraocular Movements: Extraocular movements intact.     Pupils: Pupils are equal, round, and reactive to light.  Skin:    General: Skin is warm and dry.     Coloration: Skin is not jaundiced or pale.     Findings: No rash.  Neurological:     Mental Status: He is alert and oriented to person, place, and time. Mental status is at baseline.  Psychiatric:        Mood and Affect: Mood normal.        Behavior: Behavior normal.        Thought Content: Thought content normal.        Judgment: Judgment normal.      No results found. No results found. No results found for this or any previous visit (from the past 24 hour(s)).  Assessment/Plan: Kyle Yu is a 46 y.o. male present for OV for  Parotitis Improved.  Swelling improved and small stone is no longer present  Pain is resolved Finish abx prescribed F/u prn  Reviewed expectations re: course of current medical issues. Discussed self-management of  symptoms. Outlined signs and symptoms indicating need for more acute intervention. Patient verbalized understanding and all questions were answered. Patient received an After-Visit Summary.    No orders of the defined types were placed in this encounter.  No orders of the defined types were placed in this encounter.  Referral Orders  No referral(s) requested today     Note is dictated utilizing voice recognition software. Although note has been proof read prior to signing, occasional typographical errors still can be missed. If any questions arise, please do not hesitate to call for verification.   electronically signed by:  Howard Pouch, DO  [place]

## 2022-09-16 NOTE — Patient Instructions (Signed)
The gland is no longer swollen or infected.   Hydrate with water.

## 2022-10-20 ENCOUNTER — Encounter: Payer: Self-pay | Admitting: Family Medicine

## 2022-10-21 NOTE — Telephone Encounter (Signed)
Chart updated

## 2023-11-20 ENCOUNTER — Ambulatory Visit: Payer: Self-pay | Admitting: Family Medicine

## 2024-07-05 ENCOUNTER — Ambulatory Visit (INDEPENDENT_AMBULATORY_CARE_PROVIDER_SITE_OTHER): Payer: Self-pay

## 2024-07-05 DIAGNOSIS — Z09 Encounter for follow-up examination after completed treatment for conditions other than malignant neoplasm: Secondary | ICD-10-CM

## 2024-07-05 DIAGNOSIS — D122 Benign neoplasm of ascending colon: Secondary | ICD-10-CM

## 2024-07-05 DIAGNOSIS — K640 First degree hemorrhoids: Secondary | ICD-10-CM

## 2024-07-05 DIAGNOSIS — Z860101 Personal history of adenomatous and serrated colon polyps: Secondary | ICD-10-CM

## 2024-07-05 DIAGNOSIS — K635 Polyp of colon: Secondary | ICD-10-CM

## 2024-07-05 DIAGNOSIS — K5730 Diverticulosis of large intestine without perforation or abscess without bleeding: Secondary | ICD-10-CM
